# Patient Record
Sex: Male | Born: 1945
Health system: Southern US, Community
[De-identification: ages and names within clinical notes are randomized; demographics above are authoritative.]

## PROBLEM LIST (undated history)

## (undated) DIAGNOSIS — M1990 Unspecified osteoarthritis, unspecified site: Secondary | ICD-10-CM

## (undated) DIAGNOSIS — E785 Hyperlipidemia, unspecified: Secondary | ICD-10-CM

## (undated) DIAGNOSIS — N400 Enlarged prostate without lower urinary tract symptoms: Secondary | ICD-10-CM

## (undated) DIAGNOSIS — J3089 Other allergic rhinitis: Secondary | ICD-10-CM

## (undated) DIAGNOSIS — J189 Pneumonia, unspecified organism: Secondary | ICD-10-CM

## (undated) DIAGNOSIS — K5790 Diverticulosis of intestine, part unspecified, without perforation or abscess without bleeding: Secondary | ICD-10-CM

## (undated) HISTORY — PX: BACK SURGERY: SHX140

## (undated) HISTORY — PX: TONSILLECTOMY: SUR1361

## (undated) HISTORY — PX: OTHER SURGICAL HISTORY: SHX169

## (undated) HISTORY — PX: SIGMOIDOSCOPY: SUR1295

## (undated) HISTORY — PX: EYE SURGERY: SHX253

---

## 1973-01-17 HISTORY — PX: HERNIA REPAIR: SHX51

## 1988-01-18 HISTORY — PX: SHOULDER SURGERY: SHX246

## 2003-01-03 HISTORY — PX: COLONOSCOPY W/ POLYPECTOMY: SHX1380

## 2004-07-08 ENCOUNTER — Encounter: Payer: Self-pay | Admitting: Podiatry

## 2004-07-17 ENCOUNTER — Encounter: Payer: Self-pay | Admitting: Podiatry

## 2008-12-04 ENCOUNTER — Ambulatory Visit: Payer: Self-pay | Admitting: Unknown Physician Specialty

## 2009-01-17 HISTORY — PX: LAMINOTOMY / EXCISION DISK POSTERIOR CERVICAL SPINE: SUR749

## 2009-02-26 ENCOUNTER — Ambulatory Visit: Payer: Self-pay | Admitting: Family Medicine

## 2009-03-17 ENCOUNTER — Ambulatory Visit: Payer: Self-pay | Admitting: Unknown Physician Specialty

## 2009-03-19 ENCOUNTER — Ambulatory Visit: Payer: Self-pay | Admitting: Unknown Physician Specialty

## 2009-03-25 ENCOUNTER — Inpatient Hospital Stay: Payer: Self-pay | Admitting: Internal Medicine

## 2013-05-13 DIAGNOSIS — Z125 Encounter for screening for malignant neoplasm of prostate: Secondary | ICD-10-CM | POA: Diagnosis not present

## 2013-10-11 DIAGNOSIS — Z23 Encounter for immunization: Secondary | ICD-10-CM | POA: Diagnosis not present

## 2013-10-31 DIAGNOSIS — R05 Cough: Secondary | ICD-10-CM | POA: Diagnosis not present

## 2013-10-31 DIAGNOSIS — J45998 Other asthma: Secondary | ICD-10-CM | POA: Diagnosis not present

## 2013-10-31 DIAGNOSIS — J019 Acute sinusitis, unspecified: Secondary | ICD-10-CM | POA: Diagnosis not present

## 2013-11-05 DIAGNOSIS — J180 Bronchopneumonia, unspecified organism: Secondary | ICD-10-CM | POA: Diagnosis not present

## 2013-11-05 DIAGNOSIS — J45909 Unspecified asthma, uncomplicated: Secondary | ICD-10-CM | POA: Diagnosis not present

## 2014-01-01 ENCOUNTER — Ambulatory Visit: Payer: Self-pay | Admitting: Unknown Physician Specialty

## 2014-01-01 DIAGNOSIS — K621 Rectal polyp: Secondary | ICD-10-CM | POA: Diagnosis not present

## 2014-01-01 DIAGNOSIS — D120 Benign neoplasm of cecum: Secondary | ICD-10-CM | POA: Diagnosis not present

## 2014-01-01 DIAGNOSIS — K640 First degree hemorrhoids: Secondary | ICD-10-CM | POA: Diagnosis not present

## 2014-01-01 DIAGNOSIS — K5730 Diverticulosis of large intestine without perforation or abscess without bleeding: Secondary | ICD-10-CM | POA: Diagnosis not present

## 2014-01-01 DIAGNOSIS — Z8371 Family history of colonic polyps: Secondary | ICD-10-CM | POA: Diagnosis not present

## 2014-01-01 DIAGNOSIS — D122 Benign neoplasm of ascending colon: Secondary | ICD-10-CM | POA: Diagnosis not present

## 2014-01-01 DIAGNOSIS — Z1211 Encounter for screening for malignant neoplasm of colon: Secondary | ICD-10-CM | POA: Diagnosis not present

## 2014-01-01 DIAGNOSIS — K648 Other hemorrhoids: Secondary | ICD-10-CM | POA: Diagnosis not present

## 2014-01-01 DIAGNOSIS — D128 Benign neoplasm of rectum: Secondary | ICD-10-CM | POA: Diagnosis not present

## 2014-01-23 DIAGNOSIS — N400 Enlarged prostate without lower urinary tract symptoms: Secondary | ICD-10-CM | POA: Diagnosis not present

## 2014-01-23 DIAGNOSIS — Z125 Encounter for screening for malignant neoplasm of prostate: Secondary | ICD-10-CM | POA: Diagnosis not present

## 2014-01-23 DIAGNOSIS — N528 Other male erectile dysfunction: Secondary | ICD-10-CM | POA: Diagnosis not present

## 2014-01-23 DIAGNOSIS — Z0000 Encounter for general adult medical examination without abnormal findings: Secondary | ICD-10-CM | POA: Diagnosis not present

## 2014-01-23 DIAGNOSIS — Z23 Encounter for immunization: Secondary | ICD-10-CM | POA: Diagnosis not present

## 2014-02-04 DIAGNOSIS — N529 Male erectile dysfunction, unspecified: Secondary | ICD-10-CM | POA: Diagnosis not present

## 2014-02-04 DIAGNOSIS — Z8042 Family history of malignant neoplasm of prostate: Secondary | ICD-10-CM | POA: Diagnosis not present

## 2014-02-04 DIAGNOSIS — N401 Enlarged prostate with lower urinary tract symptoms: Secondary | ICD-10-CM | POA: Diagnosis not present

## 2014-02-21 DIAGNOSIS — H2513 Age-related nuclear cataract, bilateral: Secondary | ICD-10-CM | POA: Diagnosis not present

## 2014-03-20 DIAGNOSIS — R21 Rash and other nonspecific skin eruption: Secondary | ICD-10-CM | POA: Diagnosis not present

## 2014-03-20 DIAGNOSIS — L259 Unspecified contact dermatitis, unspecified cause: Secondary | ICD-10-CM | POA: Diagnosis not present

## 2014-04-07 DIAGNOSIS — Z5181 Encounter for therapeutic drug level monitoring: Secondary | ICD-10-CM | POA: Diagnosis not present

## 2014-04-07 DIAGNOSIS — L821 Other seborrheic keratosis: Secondary | ICD-10-CM | POA: Diagnosis not present

## 2014-04-07 DIAGNOSIS — D485 Neoplasm of uncertain behavior of skin: Secondary | ICD-10-CM | POA: Diagnosis not present

## 2014-04-07 DIAGNOSIS — L438 Other lichen planus: Secondary | ICD-10-CM | POA: Diagnosis not present

## 2014-04-11 ENCOUNTER — Ambulatory Visit: Payer: Self-pay | Admitting: Physician Assistant

## 2014-04-11 DIAGNOSIS — J40 Bronchitis, not specified as acute or chronic: Secondary | ICD-10-CM | POA: Diagnosis not present

## 2014-04-11 DIAGNOSIS — R05 Cough: Secondary | ICD-10-CM | POA: Diagnosis not present

## 2014-04-11 DIAGNOSIS — Z87891 Personal history of nicotine dependence: Secondary | ICD-10-CM | POA: Diagnosis not present

## 2014-05-12 DIAGNOSIS — L814 Other melanin hyperpigmentation: Secondary | ICD-10-CM | POA: Diagnosis not present

## 2014-05-12 DIAGNOSIS — L438 Other lichen planus: Secondary | ICD-10-CM | POA: Diagnosis not present

## 2014-05-12 LAB — SURGICAL PATHOLOGY

## 2014-08-12 ENCOUNTER — Ambulatory Visit: Payer: Self-pay | Admitting: Urology

## 2014-08-12 ENCOUNTER — Encounter: Payer: Self-pay | Admitting: Urology

## 2014-10-02 ENCOUNTER — Other Ambulatory Visit: Payer: Self-pay

## 2014-10-02 ENCOUNTER — Emergency Department: Payer: Medicare Other

## 2014-10-02 ENCOUNTER — Encounter: Payer: Self-pay | Admitting: Emergency Medicine

## 2014-10-02 ENCOUNTER — Emergency Department
Admission: EM | Admit: 2014-10-02 | Discharge: 2014-10-02 | Disposition: A | Discharge status: Home / Self Care | Payer: Medicare Other | Attending: Emergency Medicine | Admitting: Emergency Medicine

## 2014-10-02 DIAGNOSIS — R42 Dizziness and giddiness: Secondary | ICD-10-CM | POA: Diagnosis not present

## 2014-10-02 DIAGNOSIS — R531 Weakness: Secondary | ICD-10-CM | POA: Diagnosis not present

## 2014-10-02 DIAGNOSIS — R112 Nausea with vomiting, unspecified: Secondary | ICD-10-CM | POA: Insufficient documentation

## 2014-10-02 DIAGNOSIS — J439 Emphysema, unspecified: Secondary | ICD-10-CM | POA: Diagnosis not present

## 2014-10-02 DIAGNOSIS — R5383 Other fatigue: Secondary | ICD-10-CM | POA: Diagnosis present

## 2014-10-02 DIAGNOSIS — R110 Nausea: Secondary | ICD-10-CM | POA: Diagnosis not present

## 2014-10-02 LAB — COMPREHENSIVE METABOLIC PANEL
ALT: 24 U/L (ref 17–63)
ANION GAP: 5 (ref 5–15)
AST: 35 U/L (ref 15–41)
Albumin: 4.5 g/dL (ref 3.5–5.0)
Alkaline Phosphatase: 86 U/L (ref 38–126)
BUN: 19 mg/dL (ref 6–20)
CHLORIDE: 113 mmol/L — AB (ref 101–111)
CO2: 24 mmol/L (ref 22–32)
Calcium: 9.2 mg/dL (ref 8.9–10.3)
Creatinine, Ser: 1.02 mg/dL (ref 0.61–1.24)
GFR calc non Af Amer: >60 mL/min (ref >60)
Glucose, Bld: 150 mg/dL — ABNORMAL HIGH (ref 65–99)
Potassium: 4.7 mmol/L (ref 3.5–5.1)
SODIUM: 142 mmol/L (ref 135–145)
Total Bilirubin: 0.8 mg/dL (ref 0.3–1.2)
Total Protein: 7.3 g/dL (ref 6.5–8.1)

## 2014-10-02 LAB — URINALYSIS COMPLETE WITH MICROSCOPIC (ARMC ONLY)
BILIRUBIN URINE: NEGATIVE
Glucose, UA: NEGATIVE mg/dL
Hgb urine dipstick: NEGATIVE
KETONES UR: NEGATIVE mg/dL
Leukocytes, UA: NEGATIVE
NITRITE: NEGATIVE
PROTEIN: NEGATIVE mg/dL
Specific Gravity, Urine: 1.019 (ref 1.005–1.030)
Squamous Epithelial / LPF: NONE SEEN
pH: 5 (ref 5.0–8.0)

## 2014-10-02 LAB — CBC
HCT: 44.1 % (ref 40.0–52.0)
Hemoglobin: 14.6 g/dL (ref 13.0–18.0)
MCH: 27.1 pg (ref 26.0–34.0)
MCHC: 33.1 g/dL (ref 32.0–36.0)
MCV: 81.9 fL (ref 80.0–100.0)
PLATELETS: 190 10*3/uL (ref 150–440)
RBC: 5.38 MIL/uL (ref 4.40–5.90)
RDW: 15.8 % — ABNORMAL HIGH (ref 11.5–14.5)
WBC: 9.5 10*3/uL (ref 3.8–10.6)

## 2014-10-02 LAB — TROPONIN I: Troponin I: <0.03 ng/mL (ref <0.031)

## 2014-10-02 LAB — LIPASE, BLOOD
LIPASE: 32 U/L (ref 22–51)
LIPASE: 27 U/L (ref 22–51)

## 2014-10-02 MED ORDER — ONDANSETRON 4 MG PO TBDP
4.0000 mg | ORAL_TABLET | Freq: Once | ORAL | Status: AC
  Administered 2014-10-02: 4 mg via ORAL

## 2014-10-02 MED ORDER — ONDANSETRON 4 MG PO TBDP
ORAL_TABLET | ORAL | Status: AC
  Administered 2014-10-02: 4 mg via ORAL
  Filled 2014-10-02: qty 1

## 2014-10-02 MED ORDER — SODIUM CHLORIDE 0.9 % IV BOLUS (SEPSIS)
1000.0000 mL | Freq: Once | INTRAVENOUS | Status: AC
  Administered 2014-10-02: 1000 mL via INTRAVENOUS

## 2014-10-02 NOTE — ED Provider Notes (Addendum)
Second troponin is negative urinalysis is normal. Further repeat interview with the patient's extremities reveals that he felt dizzy and spinning. After this had been going on for little bit he became very nauseated and vomited repeatedly. He was dizzy to the point he had difficulty walking straight. All the symptoms are gone now. There is no inducible vertigo there is no nystagmus. Patient moves all extremities equally and well and is no weakness or focal sensory deficits. I feel with a normal CAT scan and normal labs normal EKG and the resolution of symptoms and the fact that the symptoms were accompanied by 6 vigorous nausea and vomiting that this was most likely a peripheral vertigo. I discussed with the patient the fact that since everything is gone now like there is no way of evaluating him for any kind of stroke or TIA but again since there was such pronounced nausea and vomiting most likely this was a peripheral problem he is to come back if he gets any worse or if the symptoms return and he is to follow-up with his regular doctor  Nena Polio, MD 10/02/14 1731 Discussed with neurology on-call he also thinks is likely that it was peripheral with all the nausea and vomiting that was going on.  Nena Polio, MD 10/02/14 1750 Dr. Tamala Julian the neurologist calls back he suggests that we make sure the patient taking 81 mg a day of aspirin. Patient says he is doing that already.  Nena Polio, MD 10/02/14 [phone]

## 2014-10-02 NOTE — ED Provider Notes (Signed)
[facility] Emergency Department Provider Note  ____________________________________________  Time seen: Approximately 2:56 PM  I have reviewed the triage vital signs and the nursing notes.   HISTORY  Chief Complaint Fatigue    HPI George Davis is a 69 y.o. male who is healthy, states he woke up and had some vomiting this morning. Felt somewhat lightheaded. Denied any melena or bright red blood per rectum headache chest pain shortness of breath. He denied any diarrhea or abdominal pain. He had no headache or focal numbness or weakness. He had no abdominal pain. The patient states at this time he feels much better after having thrown up. He did have a normal bowel movement this morning. He did have a remote history of a hernia repair but he has no abdominal tenderness or abdominal painhe denies dysuria or urinary frequency.  History reviewed. No pertinent past medical history.  There are no active problems to display for this patient.   Past Surgical History  Procedure Laterality Date  . Joint replacement      shoulder surgery  . Hernia repair      No current outpatient prescriptions on file.  Allergies Review of patient's allergies indicates no known allergies.  History reviewed. No pertinent family history.  Social History Social History  Substance Use Topics  . Smoking status: Never Smoker   . Smokeless tobacco: None  . Alcohol Use: Yes     Comment: occasionally    Review of Systems Constitutional: No fever/chills Eyes: No visual changes. ENT: No sore throat. Cardiovascular: Denies chest pain. Respiratory: Denies shortness of breath. Gastrointestinal: No abdominal pain.  Positive nausea, positive vomiting.  No diarrhea.  No constipation. Genitourinary: Negative for dysuria. Musculoskeletal: Negative for back pain. Skin: Negative for rash. Neurological: Negative for headaches, focal weakness or numbness. 10-point ROS otherwise  negative.  ____________________________________________   PHYSICAL EXAM:  VITAL SIGNS: ED Triage Vitals  Enc Vitals Group     BP 10/02/14 1030 138/84 mmHg     Pulse Rate 10/02/14 1030 71     Resp 10/02/14 1030 16     Temp 10/02/14 1030 97.4 F (36.3 C)     Temp Source 10/02/14 1030 Oral     SpO2 10/02/14 1026 94 %     Weight 10/02/14 1030 185 lb (83.915 kg)     Height 10/02/14 1030 5\' 8"  (1.727 m)     Head Cir --      Peak Flow --      Pain Score 10/02/14 1031 0     Pain Loc --      Pain Edu? --      Excl. in [place]? --     Constitutional: Alert and oriented. Well appearing and in no acute distress. Eyes: Conjunctivae are normal. PERRL. EOMI. Head: Atraumatic. Nose: No congestion/rhinnorhea. Mouth/Throat: Mucous membranes are moist.  Oropharynx non-erythematous. Neck: No stridor.   Cardiovascular: Normal rate, regular rhythm. Grossly normal heart sounds.  Good peripheral circulation. Respiratory: Normal respiratory effort.  No retractions. Lungs CTAB. Gastrointestinal: Soft and nontender. No distention. No abdominal bruits. No CVA tenderness. Musculoskeletal: No lower extremity tenderness nor edema.  No joint effusions. Neurologic:  Normal speech and language. No gross focal neurologic deficits are appreciated. No gait instability. Skin:  Skin is warm, dry and intact. No rash noted. Psychiatric: Mood and affect are normal. Speech and behavior are normal.  ____________________________________________   LABS (all labs ordered are listed, but only abnormal results are displayed)  Labs Reviewed  CBC - Abnormal; Notable for the following:    RDW 15.8 (*)    All other components within normal limits  COMPREHENSIVE METABOLIC PANEL - Abnormal; Notable for the following:    Chloride 113 (*)    Glucose, Bld 150 (*)    All other components within normal limits  TROPONIN I  LIPASE, BLOOD  LIPASE, BLOOD  TROPONIN I   ____________________________________________  EKG  I  have personally reviewed the patient's EKG, normal sinus rhythm rate 65 bpm no acute ischemic changes ____________________________________________  RADIOLOGY I have reviewed x-rays  ____________________________________________   PROCEDURES  Procedure(s) performed: None  Critical Care performed: None  ____________________________________________   INITIAL IMPRESSION / ASSESSMENT AND PLAN / ED COURSE  Pertinent labs & imaging results that were available during my care of the patient were reviewed by me and considered in my medical decision making (see chart for details).  Patient had vomiting this morning and felt generally unwell, he feels much better now. Serial abdominal exams have be treated no evidence of abdominal tenderness or intra-abdominal pathology. Nothing to suggest SBO. Isolated vomiting in a patient this age can suddenly be many different entities and we have therefore instituted a broad workup. There is no evidence that he had acute R: Ischemia there is no evidence of ischemic gut there is no evidence of pain out of portion to exam there is no evidence of SBO on exam there is no evidence of referred cardiac disease or is no evidence of increased intracranial pressure or etc. Patient feels much better without like to try by mouth challenge, will send a second troponin and if that is negative we will discharge him home with close follow-up with his PCP and return precautions. ____________________________________________   FINAL CLINICAL IMPRESSION(S) / ED DIAGNOSES  Final diagnoses:  None     Schuyler Amor, MD 10/02/14 [phone]

## 2014-10-02 NOTE — ED Notes (Signed)
Patient presents to the ED via EMS from home for weakness and dizziness when sitting up.  Patient reports frequent vomiting this morning.  Patient states he started feeling "sick" last night.  Patient appears pale and weak.  Vomiting x 1 during triage.  Patient is alert and oriented x 4.  Denies headache, denies falling.

## 2014-11-14 DIAGNOSIS — Z23 Encounter for immunization: Secondary | ICD-10-CM | POA: Diagnosis not present

## 2014-12-28 ENCOUNTER — Ambulatory Visit
Admission: EM | Admit: 2014-12-28 | Discharge: 2014-12-28 | Disposition: A | Discharge status: Home / Self Care | Payer: Medicare Other | Attending: Family Medicine | Admitting: Family Medicine

## 2014-12-28 ENCOUNTER — Ambulatory Visit: Payer: Medicare Other

## 2014-12-28 ENCOUNTER — Encounter: Payer: Self-pay | Admitting: Gynecology

## 2014-12-28 DIAGNOSIS — H6593 Unspecified nonsuppurative otitis media, bilateral: Secondary | ICD-10-CM

## 2014-12-28 DIAGNOSIS — R918 Other nonspecific abnormal finding of lung field: Secondary | ICD-10-CM | POA: Insufficient documentation

## 2014-12-28 DIAGNOSIS — J439 Emphysema, unspecified: Secondary | ICD-10-CM

## 2014-12-28 DIAGNOSIS — J0111 Acute recurrent frontal sinusitis: Secondary | ICD-10-CM | POA: Diagnosis not present

## 2014-12-28 DIAGNOSIS — J189 Pneumonia, unspecified organism: Secondary | ICD-10-CM

## 2014-12-28 DIAGNOSIS — R05 Cough: Secondary | ICD-10-CM | POA: Insufficient documentation

## 2014-12-28 DIAGNOSIS — R0602 Shortness of breath: Secondary | ICD-10-CM | POA: Diagnosis not present

## 2014-12-28 LAB — RAPID STREP SCREEN (MED CTR MEBANE ONLY): Streptococcus, Group A Screen (Direct): NEGATIVE

## 2014-12-28 LAB — RAPID INFLUENZA A&B ANTIGENS
Influenza A (ARMC): NOT DETECTED
Influenza B (ARMC): NOT DETECTED

## 2014-12-28 MED ORDER — ACETAMINOPHEN 500 MG PO TABS
1000.0000 mg | ORAL_TABLET | Freq: Once | ORAL | Status: AC
  Administered 2014-12-28: 1000 mg via ORAL

## 2014-12-28 MED ORDER — LEVOFLOXACIN 500 MG PO TABS: 500.0000 mg | ORAL_TABLET | Freq: Every day | ORAL | Status: DC

## 2014-12-28 MED ORDER — IPRATROPIUM-ALBUTEROL 0.5-2.5 (3) MG/3ML IN SOLN
3.0000 mL | Freq: Four times a day (QID) | RESPIRATORY_TRACT | Status: DC
  Administered 2014-12-28: 3 mL via RESPIRATORY_TRACT

## 2014-12-28 MED ORDER — FLUTICASONE PROPIONATE 50 MCG/ACT NA SUSP: 1.0000 | Freq: Two times a day (BID) | NASAL | Status: DC

## 2014-12-28 NOTE — Discharge Instructions (Signed)
Acute Bronchitis °Bronchitis is inflammation of the airways that extend from the windpipe into the lungs (bronchi). The inflammation often causes mucus to develop. This leads to a cough, which is the most common symptom of bronchitis.  °In acute bronchitis, the condition usually develops suddenly and goes away over time, usually in a couple weeks. Smoking, allergies, and asthma can make bronchitis worse. Repeated episodes of bronchitis may cause further lung problems.  °CAUSES °Acute bronchitis is most often caused by the same virus that causes a cold. The virus can spread from person to person (contagious) through coughing, sneezing, and touching contaminated objects. °SIGNS AND SYMPTOMS  °· Cough.   °· Fever.   °· Coughing up mucus.   °· Body aches.   °· Chest congestion.   °· Chills.   °· Shortness of breath.   °· Sore throat.   °DIAGNOSIS  °Acute bronchitis is usually diagnosed through a physical exam. Your health care provider will also ask you questions about your medical history. Tests, such as chest X-rays, are sometimes done to rule out other conditions.  °TREATMENT  °Acute bronchitis usually goes away in a couple weeks. Oftentimes, no medical treatment is necessary. Medicines are sometimes given for relief of fever or cough. Antibiotic medicines are usually not needed but may be prescribed in certain situations. In some cases, an inhaler may be recommended to help reduce shortness of breath and control the cough. A cool mist vaporizer may also be used to help thin bronchial secretions and make it easier to clear the chest.  °HOME CARE INSTRUCTIONS °· Get plenty of rest.   °· Drink enough fluids to keep your urine clear or pale yellow (unless you have a medical condition that requires fluid restriction). Increasing fluids may help thin your respiratory secretions (sputum) and reduce chest congestion, and it will prevent dehydration.   °· Take medicines only as directed by your health care provider. °· If  you were prescribed an antibiotic medicine, finish it all even if you start to feel better. °· Avoid smoking and secondhand smoke. Exposure to cigarette smoke or irritating chemicals will make bronchitis worse. If you are a smoker, consider using nicotine gum or skin patches to help control withdrawal symptoms. Quitting smoking will help your lungs heal faster.   °· Reduce the chances of another bout of acute bronchitis by washing your hands frequently, avoiding people with cold symptoms, and trying not to touch your hands to your mouth, nose, or eyes.   °· Keep all follow-up visits as directed by your health care provider.   °SEEK MEDICAL CARE IF: °Your symptoms do not improve after 1 week of treatment.  °SEEK IMMEDIATE MEDICAL CARE IF: °· You develop an increased fever or chills.   °· You have chest pain.   °· You have severe shortness of breath. °· You have bloody sputum.   °· You develop dehydration. °· You faint or repeatedly feel like you are going to pass out. °· You develop repeated vomiting. °· You develop a severe headache. °MAKE SURE YOU:  °· Understand these instructions. °· Will watch your condition. °· Will get help right away if you are not doing well or get worse. °  °This information is not intended to replace advice given to you by your health care provider. Make sure you discuss any questions you have with your health care provider. °  °Document Released: 02/11/2004 Document Revised: 01/24/2014 Document Reviewed: 06/26/2012 °Elsevier Interactive Patient Education ©2016 Elsevier Inc[employer] °Otitis Media With Effusion °Otitis media with effusion is the presence of fluid in the middle ear. This is   a common problem in children, which often follows ear infections. It may be present for weeks or longer after the infection. Unlike an acute ear infection, otitis media with effusion refers only to fluid behind the ear drum and not infection. Children with repeated ear and sinus infections and allergy problems  are the most likely to get otitis media with effusion. CAUSES  The most frequent cause of the fluid buildup is dysfunction of the eustachian tubes. These are the tubes that drain fluid in the ears to the back of the nose (nasopharynx). SYMPTOMS   The main symptom of this condition is hearing loss. As a result, you or your child may:  Listen to the TV at a loud volume.  Not respond to questions.  Ask "what" often when spoken to.  Mistake or confuse one sound or word for another.  There may be a sensation of fullness or pressure but usually not pain. DIAGNOSIS   Your health care provider will diagnose this condition by examining you or your child's ears.  Your health care provider may test the pressure in you or your child's ear with a tympanometer.  A hearing test may be conducted if the problem persists. TREATMENT   Treatment depends on the duration and the effects of the effusion.  Antibiotics, decongestants, nose drops, and cortisone-type drugs (tablets or nasal spray) may not be helpful.  Children with persistent ear effusions may have delayed language or behavioral problems. Children at risk for developmental delays in hearing, learning, and speech may require referral to a specialist earlier than children not at risk.  You or your child's health care provider may suggest a referral to an ear, nose, and throat surgeon for treatment. The following may help restore normal hearing:  Drainage of fluid.  Placement of ear tubes (tympanostomy tubes).  Removal of adenoids (adenoidectomy). HOME CARE INSTRUCTIONS   Avoid secondhand smoke.  Infants who are breastfed are less likely to have this condition.  Avoid feeding infants while they are lying flat.  Avoid known environmental allergens.  Avoid people who are sick. SEEK MEDICAL CARE IF:   Hearing is not better in 3 months.  Hearing is worse.  Ear pain.  Drainage from the ear.  Dizziness. MAKE SURE YOU:    Understand these instructions.  Will watch your condition.  Will get help right away if you are not doing well or get worse.   This information is not intended to replace advice given to you by your health care provider. Make sure you discuss any questions you have with your health care provider.   Document Released: 02/11/2004 Document Revised: 01/24/2014 Document Reviewed: 07/31/2012 Elsevier Interactive Patient Education 2016 Elsevier Inc. Sinusitis, Adult Sinusitis is redness, soreness, and inflammation of the paranasal sinuses. Paranasal sinuses are air pockets within the bones of your face. They are located beneath your eyes, in the middle of your forehead, and above your eyes. In healthy paranasal sinuses, mucus is able to drain out, and air is able to circulate through them by way of your nose. However, when your paranasal sinuses are inflamed, mucus and air can become trapped. This can allow bacteria and other germs to grow and cause infection. Sinusitis can develop quickly and last only a short time (acute) or continue over a long period (chronic). Sinusitis that lasts for more than 12 weeks is considered chronic. CAUSES Causes of sinusitis include:  Allergies.  Structural abnormalities, such as displacement of the cartilage that separates your nostrils (deviated septum),  which can decrease the air flow through your nose and sinuses and affect sinus drainage.  Functional abnormalities, such as when the small hairs (cilia) that line your sinuses and help remove mucus do not work properly or are not present. SIGNS AND SYMPTOMS Symptoms of acute and chronic sinusitis are the same. The primary symptoms are pain and pressure around the affected sinuses. Other symptoms include:  Upper toothache.  Earache.  Headache.  Bad breath.  Decreased sense of smell and taste.  A cough, which worsens when you are lying flat.  Fatigue.  Fever.  Thick drainage from your nose, which  often is green and may contain pus (purulent).  Swelling and warmth over the affected sinuses. DIAGNOSIS Your health care provider will perform a physical exam. During your exam, your health care provider may perform any of the following to help determine if you have acute sinusitis or chronic sinusitis:  Look in your nose for signs of abnormal growths in your nostrils (nasal polyps).  Tap over the affected sinus to check for signs of infection.  View the inside of your sinuses using an imaging device that has a light attached (endoscope). If your health care provider suspects that you have chronic sinusitis, one or more of the following tests may be recommended:  Allergy tests.  Nasal culture. A sample of mucus is taken from your nose, sent to a lab, and screened for bacteria.  Nasal cytology. A sample of mucus is taken from your nose and examined by your health care provider to determine if your sinusitis is related to an allergy. TREATMENT Most cases of acute sinusitis are related to a viral infection and will resolve on their own within 10 days. Sometimes, medicines are prescribed to help relieve symptoms of both acute and chronic sinusitis. These may include pain medicines, decongestants, nasal steroid sprays, or saline sprays. However, for sinusitis related to a bacterial infection, your health care provider will prescribe antibiotic medicines. These are medicines that will help kill the bacteria causing the infection. Rarely, sinusitis is caused by a fungal infection. In these cases, your health care provider will prescribe antifungal medicine. For some cases of chronic sinusitis, surgery is needed. Generally, these are cases in which sinusitis recurs more than 3 times per year, despite other treatments. HOME CARE INSTRUCTIONS  Drink plenty of water. Water helps thin the mucus so your sinuses can drain more easily.  Use a humidifier.  Inhale steam 3-4 times a day (for example, sit  in the bathroom with the shower running).  Apply a warm, moist washcloth to your face 3-4 times a day, or as directed by your health care provider.  Use saline nasal sprays to help moisten and clean your sinuses.  Take medicines only as directed by your health care provider.  If you were prescribed either an antibiotic or antifungal medicine, finish it all even if you start to feel better. SEEK IMMEDIATE MEDICAL CARE IF:  You have increasing pain or severe headaches.  You have nausea, vomiting, or drowsiness.  You have swelling around your face.  You have vision problems.  You have a stiff neck.  You have difficulty breathing.   This information is not intended to replace advice given to you by your health care provider. Make sure you discuss any questions you have with your health care provider.   Document Released: 01/03/2005 Document Revised: 01/24/2014 Document Reviewed: 01/18/2011 Elsevier Interactive Patient Education 2016 [place] Pneumonia, Adult Pneumonia is an infection of  the lungs. There are different types of pneumonia. One type can develop while a person is in a hospital. A different type, called community-acquired pneumonia, develops in people who are not, or have not recently been, in the hospital or other health care facility.  CAUSES Pneumonia may be caused by bacteria, viruses, or funguses. Community-acquired pneumonia is often caused by Streptococcus pneumonia bacteria. These bacteria are often passed from one person to another by breathing in droplets from the cough or sneeze of an infected person. RISK FACTORS The condition is more likely to develop in:  People who havechronic diseases, such as chronic obstructive pulmonary disease (COPD), asthma, congestive heart failure, cystic fibrosis, diabetes, or kidney disease.  People who haveearly-stage or late-stage HIV.  People who havesickle cell disease.  People who havehad their  spleen removed (splenectomy).  People who havepoor Human resources officer.  People who havemedical conditions that increase the risk of breathing in (aspirating) secretions their own mouth and nose.   People who havea weakened immune system (immunocompromised).  People who smoke.  People whotravel to areas where pneumonia-causing germs commonly exist.  People whoare around animal habitats or animals that have pneumonia-causing germs, including birds, bats, rabbits, cats, and farm animals. SYMPTOMS Symptoms of this condition include:  Adry cough.  A wet (productive) cough.  Fever.  Sweating.  Chest pain, especially when breathing deeply or coughing.  Rapid breathing or difficulty breathing.  Shortness of breath.  Shaking chills.  Fatigue.  Muscle aches. DIAGNOSIS Your health care provider will take a medical history and perform a physical exam. You may also have other tests, including:  Imaging studies of your chest, including X-rays.  Tests to check your blood oxygen level and other blood gases.  Other tests on blood, mucus (sputum), fluid around your lungs (pleural fluid), and urine. If your pneumonia is severe, other tests may be done to identify the specific cause of your illness. TREATMENT The type of treatment that you receive depends on many factors, such as the cause of your pneumonia, the medicines you take, and other medical conditions that you have. For most adults, treatment and recovery from pneumonia may occur at home. In some cases, treatment must happen in a hospital. Treatment may include:  Antibiotic medicines, if the pneumonia was caused by bacteria.  Antiviral medicines, if the pneumonia was caused by a virus.  Medicines that are given by mouth or through an IV tube.  Oxygen.  Respiratory therapy. Although rare, treating severe pneumonia may include:  Mechanical ventilation. This is done if you are not breathing well on your own and you  cannot maintain a safe blood oxygen level.  Thoracentesis. This procedureremoves fluid around one lung or both lungs to help you breathe better. HOME CARE INSTRUCTIONS  Take over-the-counter and prescription medicines only as told by your health care provider.  Only takecough medicine if you are losing sleep. Understand that cough medicine can prevent your body's natural ability to remove mucus from your lungs.  If you were prescribed an antibiotic medicine, take it as told by your health care provider. Do not stop taking the antibiotic even if you start to feel better.  Sleep in a semi-upright position at night. Try sleeping in a reclining chair, or place a few pillows under your head.  Do not use tobacco products, including cigarettes, chewing tobacco, and e-cigarettes. If you need help quitting, ask your health care provider.  Drink enough water to keep your urine clear or pale yellow. This will help  to thin out mucus secretions in your lungs. PREVENTION There are ways that you can decrease your risk of developing community-acquired pneumonia. Consider getting a pneumococcal vaccine if:  You are older than 70 years of age.  You are older than 70 years of age and are undergoing cancer treatment, have chronic lung disease, or have other medical conditions that affect your immune system. Ask your health care provider if this applies to you. There are different types and schedules of pneumococcal vaccines. Ask your health care provider which vaccination option is best for you. You may also prevent community-acquired pneumonia if you take these actions:  Get an influenza vaccine every year. Ask your health care provider which type of influenza vaccine is best for you.  Go to the dentist on a regular basis.  Wash your hands often. Use hand sanitizer if soap and water are not available. SEEK MEDICAL CARE IF:  You have a fever.  You are losing sleep because you cannot control your  cough with cough medicine. SEEK IMMEDIATE MEDICAL CARE IF:  You have worsening shortness of breath.  You have increased chest pain.  Your sickness becomes worse, especially if you are an older adult or have a weakened immune system.  You cough up blood.   This information is not intended to replace advice given to you by your health care provider. Make sure you discuss any questions you have with your health care provider.   Document Released: 01/03/2005 Document Revised: 09/24/2014 Document Reviewed: 04/30/2014 Elsevier Interactive Patient Education [employer].

## 2014-12-28 NOTE — ED Provider Notes (Signed)
CSN: [id number]     Arrival date & time 12/28/14  1507 History   First MD Initiated Contact with Patient 12/28/14 1603     Chief Complaint  Patient presents with  . 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    History reviewed. No pertinent past medical history. Past Surgical History  Procedure Laterality Date  . Joint replacement      shoulder surgery  . Hernia repair    . Laminotomy / excision disk posterior cervical spine    . Sigmoidoscopy     History reviewed. No pertinent family history. Social History  Substance Use Topics  . Smoking status: Former Smoker    Types: Cigarettes  . Smokeless tobacco: None  . Alcohol Use: Yes     Comment: occasionally    Review of Systems  Constitutional: Positive for fever, chills, activity change and fatigue. Negative for diaphoresis, appetite change and unexpected weight change.  HENT: Positive for congestion, postnasal drip and sinus pressure. Negative for dental problem, drooling, ear discharge, ear pain, facial swelling, hearing loss, mouth sores, nosebleeds, rhinorrhea, sneezing, sore throat, tinnitus, trouble swallowing and voice change.   Eyes: Negative for photophobia, pain, discharge, redness, itching and visual disturbance.  Respiratory: Positive for cough, chest tightness and shortness of breath. Negative for choking, wheezing and stridor.   Cardiovascular: Negative for chest pain, palpitations and leg swelling.  Gastrointestinal: Negative for nausea, vomiting, abdominal pain, diarrhea, constipation, blood in stool and abdominal distention.  Endocrine: Negative for cold intolerance and heat  intolerance.  Genitourinary: Negative for dysuria.  Musculoskeletal: Negative for myalgias, back pain, joint swelling, arthralgias, gait problem, neck pain and neck stiffness.  Skin: Negative for color change, pallor, rash and wound.  Allergic/Immunologic: Positive for environmental allergies. Negative for food allergies and immunocompromised state.  Neurological: Negative for dizziness, tremors, seizures, syncope, facial asymmetry, speech difficulty, weakness, light-headedness, numbness and headaches.  Hematological: Negative for adenopathy. Does not bruise/bleed easily.  Psychiatric/Behavioral: Negative for behavioral problems, confusion, sleep disturbance and agitation.    Allergies  Review of patient's allergies indicates no known allergies.  Home Medications   Prior to Admission medications   Medication Sig Start Date End Date Taking? Authorizing Provider  fluticasone (FLONASE) 50 MCG/ACT nasal spray Place 1 spray into both nostrils 2 (two) times daily. 12/28/14 01/17/15  Olen Cordial, NP  levofloxacin (LEVAQUIN) 500 MG tablet Take 1 tablet (500 mg total) by mouth daily. 12/28/14 01/07/15  Olen Cordial, NP   Meds Ordered and Administered this Visit   Medications  ipratropium-albuterol (DUONEB) 0.5-2.5 (3) MG/3ML nebulizer solution 3 mL (3 mLs Nebulization Given 12/28/14 1613)  acetaminophen (TYLENOL) tablet 1,000 mg (1,000 mg Oral Given 12/28/14 1554)    BP 124/79 mmHg  Pulse 114  Temp(Src) 102 F (38.9 C) (Oral)  Ht 5\' 8"  (1.727 m)  Wt 185 lb (83.915 kg)  BMI 28.14 kg/m2  SpO2 95% No data found.   Physical Exam  Constitutional: He is oriented to person, place, and time. Vital signs are normal. He appears well-developed and well-nourished. He is active and cooperative.  Non-toxic appearance. He does not have a sickly appearance. He appears ill. No distress.  HENT:  Head: Normocephalic and atraumatic.  Right Ear: Hearing, external  ear and ear canal normal. A  middle ear effusion is present.  Left Ear: Hearing, external ear and ear canal normal. A middle ear effusion is present.  Nose: Mucosal edema and rhinorrhea present. No nose lacerations, sinus tenderness, nasal deformity, septal deviation or nasal septal hematoma. No epistaxis.  No foreign bodies. Right sinus exhibits maxillary sinus tenderness. Right sinus exhibits no frontal sinus tenderness. Left sinus exhibits maxillary sinus tenderness and frontal sinus tenderness.  Mouth/Throat: Uvula is midline and mucous membranes are normal. Mucous membranes are not pale, not dry and not cyanotic. He does not have dentures. No oral lesions. No trismus in the jaw. Normal dentition. No dental abscesses, uvula swelling, lacerations or dental caries. Posterior oropharyngeal edema and posterior oropharyngeal erythema present. No oropharyngeal exudate or tonsillar abscesses.  Bilateral nasal turbinates edema/erythema white discharge and blood vessals excoriated; bilateral TMs with air fluid level clear; cobblestoning posterior pharynx  Eyes: Conjunctivae, EOM and lids are normal. Pupils are equal, round, and reactive to light. Right eye exhibits no chemosis, no discharge, no exudate and no hordeolum. No foreign body present in the right eye. Left eye exhibits no chemosis, no discharge, no exudate and no hordeolum. No foreign body present in the left eye. Right conjunctiva is not injected. Right conjunctiva has no hemorrhage. Left conjunctiva is not injected. Left conjunctiva has no hemorrhage. No scleral icterus. Right eye exhibits normal extraocular motion and no nystagmus. Left eye exhibits normal extraocular motion and no nystagmus. Right pupil is round and reactive. Left pupil is round and reactive. Pupils are equal.  Neck: Trachea normal and normal range of motion. Neck supple. No tracheal tenderness, no spinous process tenderness and no muscular tenderness present. No rigidity. No tracheal deviation, no edema, no  erythema and normal range of motion present. No thyroid mass and no thyromegaly present.  Cardiovascular: Normal rate, regular rhythm, S1 normal, S2 normal, normal heart sounds and intact distal pulses.  PMI is not displaced.  Exam reveals no gallop and no friction rub.   No murmur heard. Pulmonary/Chest: Effort normal. No stridor. No respiratory distress. He has decreased breath sounds in the right middle field, the right lower field, the left middle field and the left lower field. He has no wheezes. He has no rhonchi. He has no rales.  Negative egophany all fields;   Abdominal: Soft. He exhibits no distension.  Musculoskeletal: Normal range of motion. He exhibits no edema or tenderness.  Lymphadenopathy:       Head (right side): No submental, no submandibular, no tonsillar, no preauricular, no posterior auricular and no occipital adenopathy present.       Head (left side): No submental, no submandibular, no tonsillar, no preauricular, no posterior auricular and no occipital adenopathy present.    He has no cervical adenopathy.       Right cervical: No superficial cervical, no deep cervical and no posterior cervical adenopathy present.      Left cervical: No superficial cervical, no deep cervical and no posterior cervical adenopathy present.  Neurological: He is alert and oriented to person, place, and time. He displays no atrophy and no tremor. No cranial nerve deficit or sensory deficit. He exhibits normal muscle tone. He displays no seizure activity. Coordination and gait normal. GCS eye subscore is 4. GCS verbal subscore is 5. GCS motor subscore is 6.  Skin: Skin is warm, dry and intact. No abrasion, no bruising, no burn, no ecchymosis, no laceration, no lesion, no petechiae and no rash noted. He is  not diaphoretic. No cyanosis or erythema. No pallor. Nails show no clubbing.  Psychiatric: He has a normal mood and affect. His speech is normal and behavior is normal. Judgment and thought content  normal. Cognition and memory are normal.  Nursing note and vitals reviewed.   ED Course  Procedures (including critical care time)  Labs Review Labs Reviewed  RAPID STREP SCREEN (NOT AT [facility])  RAPID INFLUENZA A&B ANTIGENS ([facility] ONLY)  CULTURE, GROUP A STREP [facility] ONLY)    Imaging Review Dg Chest 2 View  12/28/2014  CLINICAL DATA:  Shortness of breath, cough for 1 week EXAM: CHEST  2 VIEW COMPARISON:  10/02/2014 FINDINGS: Cardiomediastinal silhouette is stable. There is patchy infiltrate in left upper lobe and lingula peripheral. This is suspicious for pneumonia. No pulmonary edema. Degenerative changes thoracic spine. Metallic fixation plate noted cervical spine. There is mild elevation of the right hemidiaphragm. IMPRESSION: Patchy infiltrate in left upper lobe and lingula peripheral suspicious for pneumonia. No pulmonary edema. Degenerative changes thoracic spine. Electronically Signed   By: Lahoma Crocker M.D.   On: 12/28/2014 17:36   1613 duoneb 9m cmacamille parker administered  1645 repeat spo2 room air 97% hr 82  Left chest pain still with deep breaths improved airflow after duoneb +rhonchi middle and lower lobes cxr still pending  1800 Discussed chest xray results with patient and given copy of radiology report.  Discussed treatment options agreed with levofloxacin 500mg  po daily x 10 days.  Follow up with PCM in 4 weeks for repeat chest sooner if worsening dyspnea, symptoms not improving after 48-72 hours on antibiotic/albuterol inhaler, hemoptysis  Patient verbalized understanding of information/instructions, agreed with plan of care and had no further questions at this time.   MDM   1. Acute recurrent frontal sinusitis   2. Otitis media with effusion, bilateral   3. Acute exacerbation of emphysema ([place])   4. Community acquired pneumonia    Supportive treatment.   No evidence of invasive bacterial infection, non toxic and well hydrated.  This is most likely self limiting viral  infection.  I do not see where any further testing or imaging is necessary at this time.   I will suggest supportive care, rest, good hygiene and encourage the patient to take adequate fluids.  The patient is to return to clinic or EMERGENCY ROOM if symptoms worsen or change significantly e.g. ear pain, fever, purulent discharge from ears or bleeding.  Exitcare handout on otitis media with effusion given to patient.  Patient verbalized agreement and understanding of treatment plan.    Patient notified rapid strep negative.  Will call with throat culture results typically 48 hours.  Rapid flu negative. Suspect Viral illness: no evidence of invasive bacterial infection, non toxic and well hydrated.  This is most likely self limiting viral infection.  I do not see where any further testing or imaging is necessary at this time.   I will suggest supportive care, rest, good hygiene and encourage the patient to take adequate fluids.  Does not require work excuse.  flonase 1 spray each nostril BID prn, nasal saline 1-2 sprays each nostril prn q2h, tylenol 1000mg  po QID prn.  Discussed honey with lemon and salt water gargles for comfort also.  The patient is to return to clinic or EMERGENCY ROOM if symptoms worsen or change significantly e.g. fever, lethargy, SOB, wheezing.  Exitcare handout on viral illness given to patient.  Patient verbalized agreement and understanding of treatment plan.    Restart flonase  1 spray each nostril BID.  Levofloxacin for pneumonia will also cover for sinusitis.  Discussed black box warning with patient.  No evidence of systemic bacterial infection, non toxic and well hydrated.  I do not see where any further testing or imaging is necessary at this time.   I will suggest supportive care, rest, good hygiene and encourage the patient to take adequate fluids.  The patient is to return to clinic or EMERGENCY ROOM if symptoms worsen or change significantly.  Exitcare handout on sinusitis given  to patient.  Patient verbalized agreement and understanding of treatment plan and had no further questions at this time.   P2:  Hand washing and cover cough  Hx emphysema will start oral prednisone 40mg  po with breakfast if no pneumonia. Albuterol inhaler 1-2 puffs po q4-6h prn cough/wheeze.  Humidifier use.   Bronchitis simple, community acquired, may have started as viral (probably respiratory syncytial, parainfluenza, influenza, or adenovirus), but now evidence of acute purulent bronchitis with resultant bronchial edema and mucus formation.  Viruses are the most common cause of bronchial inflammation in otherwise healthy adults with acute bronchitis.  The appearance of sputum is not predictive of whether a bacterial infection is present.  Purulent sputum is most often caused by viral infections.  There are a small portion of those caused by non-viral agents being Mycoplamsa pneumonia.  Microscopic examination or C&S of sputum in the healthy adult with acute bronchitis is generally not helpful (usually negative or normal respiratory flora) other considerations being cough from upper respiratory tract infections, sinusitis or allergic syndromes (mild asthma or viral pneumonia).  Differential Diagnosis:  reactive airway disease (asthma, allergic aspergillosis (eosinophilia), chronic bronchitis, respiratory infection (Sinusitis, Common cold, pneumonia), congestive heart failure, reflux esophagitis, bronchogenic tumor, aspiration syndromes and/or exposure irritants/tobacco smoke.  In this case, there is no evidence of any invasive bacterial illness.  Most likely viral etiology so will hold on antibiotic treatment.  Advise supportive care with rest, encourage fluids, good hygiene and watch for any worsening symptoms.  If they were to develop:  come back to the office or go to the emergency room if after hours. Without high fever, severe dyspnea, lack of physical findings or other risk factors, I will hold  CBC at  this time.I discussed that approximately 50% of patients with acute bronchitis have a cough that lasts up to three weeks, and 25% for over a month.  Tylenol, one to two tablets every four hours as needed for fever or myalgias.   No aspirin.  Patient instructed to follow up in one week or sooner if symptoms worsen. Patient verbalized agreement and understanding of treatment plan.  P2:  hand washing and cover cough  Pneumonia community acquired simple, community acquired, may have started as viral (probably respiratory syncytial, parainfluenza, influenza, or adenovirus), but now evidence of acute periobronchial pneumonia LUL/lingula on chest xray. Levofloxacin 500mg  po daily x 10 days case discussed with Dr Alveta Heimlich.  Albuterol inhaler 1-2 puffs po q4-6h prn wheezing/cough. Patient to call if no improvement symptoms still having fever or worsening dyspnea after 48 hours of antibiotics.  Given copy of chest xray results, discussed results with patient and he is to follow up with [facility] for re-evaluation in 3 to 4 weeks for repeat chest xray.  exitcare handout on pneumonia given to patient.   Differential Diagnoses:  Reactive Airway Disease (asthma, allergic aspergillosis eosinophilia), chronic bronchitis, respiratory infection (sinusitis, common cold, pneumonia), congestive heart failure, smoke/irritant exposure, reflux esophagitis, bronchogenic tumor, and/or  aspiration syndromes.  I will give   I discussed that approximately 50% of patients with acute bronchitis have a cough that lasts up to three weeks, and 25% for over a month. Tylenol, one to two tablets every four hours as needed for fever or myalgias. Patient instructed to follow up in one week with PCM or sooner if symptoms worsen. Patient verbalized agreement and understanding of treatment plan.  P2:  hand washing and cover cough   Olen Cordial, NP 12/28/14 1810

## 2014-12-28 NOTE — ED Notes (Signed)
Patient c/o chest hurt when coughing and chills x 1 week.

## 2014-12-29 MED ORDER — LEVOFLOXACIN 500 MG PO TABS: 500.0000 mg | ORAL_TABLET | Freq: Every day | ORAL | Status: AC

## 2014-12-29 MED ORDER — FLUTICASONE PROPIONATE 50 MCG/ACT NA SUSP: 1.0000 | Freq: Two times a day (BID) | NASAL | Status: DC

## 2014-12-30 ENCOUNTER — Telehealth: Payer: Self-pay | Admitting: Family Medicine

## 2014-12-30 LAB — CULTURE, GROUP A STREP (THRC)

## 2014-12-30 NOTE — Telephone Encounter (Signed)
Telephone message left for patient throat culture normal/negative.  Wanted to verify he was able to pick up levaquin yesterday from [place] and if symptoms improving also and to please contact clinic and speak with nurse.

## 2015-01-05 NOTE — Telephone Encounter (Signed)
Verified w pharmacy the Rx were picked up for patient use

## 2015-02-20 DIAGNOSIS — Z125 Encounter for screening for malignant neoplasm of prostate: Secondary | ICD-10-CM | POA: Diagnosis not present

## 2015-02-20 DIAGNOSIS — Z0000 Encounter for general adult medical examination without abnormal findings: Secondary | ICD-10-CM | POA: Diagnosis not present

## 2015-04-22 DIAGNOSIS — H2512 Age-related nuclear cataract, left eye: Secondary | ICD-10-CM | POA: Diagnosis not present

## 2015-06-22 DIAGNOSIS — H2512 Age-related nuclear cataract, left eye: Secondary | ICD-10-CM | POA: Diagnosis not present

## 2015-06-23 ENCOUNTER — Encounter: Payer: Self-pay | Admitting: *Deleted

## 2015-07-07 ENCOUNTER — Encounter: Payer: Self-pay | Admitting: *Deleted

## 2015-07-07 ENCOUNTER — Ambulatory Visit
Admission: RE | Admit: 2015-07-07 | Discharge: 2015-07-07 | Disposition: A | Discharge status: Home / Self Care | Payer: Commercial Managed Care - HMO | Source: Ambulatory Visit | Attending: Ophthalmology | Admitting: Ophthalmology

## 2015-07-07 ENCOUNTER — Ambulatory Visit: Payer: Commercial Managed Care - HMO | Admitting: Anesthesiology

## 2015-07-07 ENCOUNTER — Encounter
Admission: RE | Disposition: A | Discharge status: Home / Self Care | Payer: Self-pay | Source: Ambulatory Visit | Attending: Ophthalmology

## 2015-07-07 DIAGNOSIS — K5790 Diverticulosis of intestine, part unspecified, without perforation or abscess without bleeding: Secondary | ICD-10-CM | POA: Insufficient documentation

## 2015-07-07 DIAGNOSIS — H2512 Age-related nuclear cataract, left eye: Secondary | ICD-10-CM | POA: Diagnosis not present

## 2015-07-07 DIAGNOSIS — Z7982 Long term (current) use of aspirin: Secondary | ICD-10-CM | POA: Diagnosis not present

## 2015-07-07 DIAGNOSIS — Z79899 Other long term (current) drug therapy: Secondary | ICD-10-CM | POA: Insufficient documentation

## 2015-07-07 DIAGNOSIS — M1990 Unspecified osteoarthritis, unspecified site: Secondary | ICD-10-CM | POA: Diagnosis not present

## 2015-07-07 DIAGNOSIS — Z87891 Personal history of nicotine dependence: Secondary | ICD-10-CM | POA: Insufficient documentation

## 2015-07-07 HISTORY — DX: Unspecified osteoarthritis, unspecified site: M19.90

## 2015-07-07 HISTORY — DX: Diverticulosis of intestine, part unspecified, without perforation or abscess without bleeding: K57.90

## 2015-07-07 HISTORY — PX: CATARACT EXTRACTION W/PHACO: SHX586

## 2015-07-07 SURGERY — PHACOEMULSIFICATION, CATARACT, WITH IOL INSERTION
Anesthesia: Monitor Anesthesia Care | Site: Eye | Laterality: Left | Wound class: Clean

## 2015-07-07 MED ORDER — ARMC OPHTHALMIC DILATING GEL
1.0000 "application " | OPHTHALMIC | Status: AC | PRN
  Administered 2015-07-07 (×2): 1 via OPHTHALMIC

## 2015-07-07 MED ORDER — MOXIFLOXACIN HCL 0.5 % OP SOLN
OPHTHALMIC | Status: DC | PRN
  Administered 2015-07-07: 1 [drp] via OPHTHALMIC

## 2015-07-07 MED ORDER — NA CHONDROIT SULF-NA HYALURON 40-17 MG/ML IO SOLN
INTRAOCULAR | Status: AC
  Filled 2015-07-07: qty 1

## 2015-07-07 MED ORDER — EPINEPHRINE HCL 1 MG/ML IJ SOLN
INTRAMUSCULAR | Status: AC
  Filled 2015-07-07: qty 1

## 2015-07-07 MED ORDER — POVIDONE-IODINE 5 % OP SOLN
1.0000 "application " | Freq: Once | OPHTHALMIC | Status: AC
  Administered 2015-07-07: 1 via OPHTHALMIC

## 2015-07-07 MED ORDER — FENTANYL CITRATE (PF) 100 MCG/2ML IJ SOLN: 25.0000 ug | INTRAMUSCULAR | Status: DC | PRN

## 2015-07-07 MED ORDER — CARBACHOL 0.01 % IO SOLN
INTRAOCULAR | Status: DC | PRN
  Administered 2015-07-07: 0.5 mL via INTRAOCULAR

## 2015-07-07 MED ORDER — TETRACAINE HCL 0.5 % OP SOLN
1.0000 [drp] | Freq: Once | OPHTHALMIC | Status: AC
  Administered 2015-07-07: 1 [drp] via OPHTHALMIC

## 2015-07-07 MED ORDER — EPINEPHRINE HCL 1 MG/ML IJ SOLN
INTRAOCULAR | Status: DC | PRN
  Administered 2015-07-07: 1 mL via OPHTHALMIC

## 2015-07-07 MED ORDER — CEFUROXIME OPHTHALMIC INJECTION 1 MG/0.1 ML
INJECTION | OPHTHALMIC | Status: DC | PRN
  Administered 2015-07-07: 0.1 mL via INTRACAMERAL

## 2015-07-07 MED ORDER — NA CHONDROIT SULF-NA HYALURON 40-17 MG/ML IO SOLN
INTRAOCULAR | Status: DC | PRN
  Administered 2015-07-07: 1 mL via INTRAOCULAR

## 2015-07-07 MED ORDER — CEFUROXIME OPHTHALMIC INJECTION 1 MG/0.1 ML
INJECTION | OPHTHALMIC | Status: AC
  Filled 2015-07-07: qty 0.1

## 2015-07-07 MED ORDER — SODIUM CHLORIDE 0.9 % IV SOLN
INTRAVENOUS | Status: DC
  Administered 2015-07-07: 50 mL/h via INTRAVENOUS

## 2015-07-07 MED ORDER — MIDAZOLAM HCL 2 MG/2ML IJ SOLN
INTRAMUSCULAR | Status: DC | PRN
  Administered 2015-07-07 (×2): 1 mg via INTRAVENOUS

## 2015-07-07 MED ORDER — ONDANSETRON HCL 4 MG/2ML IJ SOLN: 4.0000 mg | Freq: Once | INTRAMUSCULAR | Status: DC | PRN

## 2015-07-07 MED ORDER — MOXIFLOXACIN HCL 0.5 % OP SOLN: 1.0000 [drp] | OPHTHALMIC | Status: DC | PRN

## 2015-07-07 SURGICAL SUPPLY — 21 items
CANNULA ANT/CHMB 27GA (MISCELLANEOUS) ×3 IMPLANT
CUP MEDICINE 2OZ PLAST GRAD ST (MISCELLANEOUS) ×3 IMPLANT
GLOVE BIO SURGEON STRL SZ8 (GLOVE) ×3 IMPLANT
GLOVE BIOGEL M 6.5 STRL (GLOVE) ×3 IMPLANT
GLOVE SURG LX 8.0 MICRO (GLOVE) ×2
GLOVE SURG LX STRL 8.0 MICRO (GLOVE) ×1 IMPLANT
GOWN STRL REUS W/ TWL LRG LVL3 (GOWN DISPOSABLE) ×2 IMPLANT
GOWN STRL REUS W/TWL LRG LVL3 (GOWN DISPOSABLE) ×4
LENS IOL TECNIS ITEC 20.5 (Intraocular Lens) ×3 IMPLANT
PACK CATARACT (MISCELLANEOUS) ×3 IMPLANT
PACK CATARACT BRASINGTON LX (MISCELLANEOUS) ×3 IMPLANT
PACK EYE AFTER SURG (MISCELLANEOUS) ×3 IMPLANT
SOL BSS BAG (MISCELLANEOUS) ×3
SOL PREP PVP 2OZ (MISCELLANEOUS) ×3
SOLUTION BSS BAG (MISCELLANEOUS) ×1 IMPLANT
SOLUTION PREP PVP 2OZ (MISCELLANEOUS) ×1 IMPLANT
SYR 3ML LL SCALE MARK (SYRINGE) ×3 IMPLANT
SYR 5ML LL (SYRINGE) ×3 IMPLANT
SYR TB 1ML 27GX1/2 LL (SYRINGE) ×3 IMPLANT
WATER STERILE IRR 1000ML POUR (IV SOLUTION) ×3 IMPLANT
WIPE NON LINTING 3.25X3.25 (MISCELLANEOUS) ×3 IMPLANT

## 2015-07-07 NOTE — Anesthesia Postprocedure Evaluation (Signed)
Anesthesia Post Note  Patient: George Davis  Procedure(s) Performed: Procedure(s) (LRB): CATARACT EXTRACTION PHACO AND INTRAOCULAR LENS PLACEMENT (IOC) (Left)  Patient location during evaluation: PACU Anesthesia Type: MAC Level of consciousness: awake, awake and alert, oriented and patient cooperative Pain management: satisfactory to patient Vital Signs Assessment: post-procedure vital signs reviewed and stable Respiratory status: spontaneous breathing Cardiovascular status: blood pressure returned to baseline and stable Postop Assessment: no headache and no backache Anesthetic complications: no    Last Vitals:  Filed Vitals:   07/07/15 0626  BP: 149/88  Pulse: 69  Temp: 36.7 C  Resp: 16    Last Pain: There were no vitals filed for this visit.               Natalia Wittmeyer C

## 2015-07-07 NOTE — Discharge Instructions (Signed)
°  Eye Surgery Discharge Instructions  Expect mild scratchy sensation or mild soreness. DO NOT RUB YOUR EYE!  The day of surgery:  Minimal physical activity, but bed rest is not required  No reading, computer work, or close hand work  No bending, lifting, or straining.  May watch TV  For 24 hours:  No driving, legal decisions, or alcoholic beverages  Safety precautions  Eat anything you prefer: It is better to start with liquids, then soup then solid foods.  _____ Eye patch should be worn until postoperative exam tomorrow.  ____ Solar shield eyeglasses should be worn for comfort in the sunlight/patch while sleeping  Resume all regular medications including aspirin or Coumadin if these were discontinued prior to surgery. You may shower, bathe, shave, or wash your hair. Tylenol may be taken for mild discomfort.  Call your doctor if you experience significant pain, nausea, or vomiting, fever > 101 or other signs of infection. [phone] or [phone] Specific instructions:  Follow-up Information    Follow up with Tim Lair, MD On 07/08/2015.   Specialty:  Ophthalmology   Why:  9:45   Contact information:   [address] [place] [address] [phone]

## 2015-07-07 NOTE — Op Note (Signed)
PREOPERATIVE DIAGNOSIS:  Nuclear sclerotic cataract of the left eye.   POSTOPERATIVE DIAGNOSIS:  nuclear sclerotic cataract left eye   OPERATIVE PROCEDURE:  Procedure(s): CATARACT EXTRACTION PHACO AND INTRAOCULAR LENS PLACEMENT (IOC)   SURGEON:  Birder Robson, MD.   ANESTHESIA:   Anesthesiologist: Iver Nestle, MD CRNA: Jennette Bill  1.      Managed anesthesia care. 2.      Topical tetracaine drops followed by 2% Xylocaine jelly applied in the preoperative holding area.   COMPLICATIONS:  None.   TECHNIQUE:   Stop and chop   DESCRIPTION OF PROCEDURE:  The patient was examined and consented in the preoperative holding area where the aforementioned topical anesthesia was applied to the left eye and then brought back to the Operating Room where the left eye was prepped and draped in the usual sterile ophthalmic fashion and a lid speculum was placed. A paracentesis was created with the side port blade and the anterior chamber was filled with viscoelastic. A near clear corneal incision was performed with the steel keratome. A continuous curvilinear capsulorrhexis was performed with a cystotome followed by the capsulorrhexis forceps. Hydrodissection and hydrodelineation were carried out with BSS on a blunt cannula. The lens was removed in a stop and chop  technique and the remaining cortical material was removed with the irrigation-aspiration handpiece. The capsular bag was inflated with viscoelastic and the Technis [id number] lens was placed in the capsular bag without complication. The remaining viscoelastic was removed from the eye with the irrigation-aspiration handpiece. The wounds were hydrated. The anterior chamber was flushed with Miostat and the eye was inflated to physiologic pressure. 0.1 mL of cefuroxime concentration 10 mg/mL was placed in the anterior chamber. The wounds were found to be water tight. The eye was dressed with Vigamox. The patient was given protective glasses to  wear throughout the day and a shield with which to sleep tonight. The patient was also given drops with which to begin a drop regimen today and will follow-up with me in one day.  Implant Name Type Inv. Item Serial No. Manufacturer Lot No. LRB No. Used  LENS IOL DIOP 20.5 - [id number] Intraocular Lens LENS IOL DIOP 20.5 [id number]   Left 1   Procedure(s) with comments: CATARACT EXTRACTION PHACO AND INTRAOCULAR LENS PLACEMENT (IOC) (Left) - [place] 00:33.8 AP% 20.7 CDE 7.01 fluid pack lot [id number]  Electronically signed: [place] 07/07/2015 7:46 AM

## 2015-07-07 NOTE — OR Nursing (Signed)
cataract glasses

## 2015-07-07 NOTE — Anesthesia Preprocedure Evaluation (Signed)
Anesthesia Evaluation  Patient identified by MRN, date of birth, ID band Patient awake    Reviewed: Allergy & Precautions, NPO status , Patient's Chart, lab work & pertinent test results  Airway Mallampati: II       Dental  (+) Teeth Intact   Pulmonary neg pulmonary ROS, former smoker,    Pulmonary exam normal        Cardiovascular negative cardio ROS   Rhythm:Regular     Neuro/Psych negative neurological ROS     GI/Hepatic negative GI ROS, Neg liver ROS,   Endo/Other  negative endocrine ROS  Renal/GU negative Renal ROS     Musculoskeletal   Abdominal Normal abdominal exam  (+)   Peds negative pediatric ROS (+)  Hematology negative hematology ROS (+)   Anesthesia Other Findings   Reproductive/Obstetrics                             Anesthesia Physical Anesthesia Plan  ASA: II  Anesthesia Plan: MAC   Post-op Pain Management:    Induction: Intravenous  Airway Management Planned: Natural Airway and Nasal Cannula  Additional Equipment:   Intra-op Plan:   Post-operative Plan:   Informed Consent: I have reviewed the patients History and Physical, chart, labs and discussed the procedure including the risks, benefits and alternatives for the proposed anesthesia with the patient or authorized representative who has indicated his/her understanding and acceptance.     Plan Discussed with: CRNA  Anesthesia Plan Comments:         Anesthesia Quick Evaluation

## 2015-07-07 NOTE — H&P (Signed)
  All labs reviewed. Abnormal studies sent to patients PCP when indicated.  Previous H&P reviewed, patient examined, there are NO CHANGES.  George Enriques LOUIS6/20/20177:14 AM

## 2015-07-07 NOTE — Transfer of Care (Signed)
Immediate Anesthesia Transfer of Care Note  Patient: George Davis  Procedure(s) Performed: Procedure(s) with comments: CATARACT EXTRACTION PHACO AND INTRAOCULAR LENS PLACEMENT (IOC) (Left) - [place] 00:33.8 AP% 20.7 CDE 7.01 fluid pack lot [id number]  Patient Location: PACU  Anesthesia Type:MAC  Level of Consciousness: awake, alert  and oriented  Airway & Oxygen Therapy: Patient Spontanous Breathing  Post-op Assessment: Report given to RN and Post -op Vital signs reviewed and stable  Post vital signs: Reviewed and stable  Last Vitals:  Filed Vitals:   07/07/15 0626  BP: 149/88  Pulse: 69  Temp: 36.7 C  Resp: 16    Last Pain: There were no vitals filed for this visit.    Patients Stated Pain Goal: 0 ([id number])  Complications: No apparent anesthesia complications

## 2015-08-05 DIAGNOSIS — H2511 Age-related nuclear cataract, right eye: Secondary | ICD-10-CM | POA: Diagnosis not present

## 2015-08-06 ENCOUNTER — Encounter: Payer: Self-pay | Admitting: *Deleted

## 2015-08-11 ENCOUNTER — Ambulatory Visit: Payer: Commercial Managed Care - HMO | Admitting: Anesthesiology

## 2015-08-11 ENCOUNTER — Ambulatory Visit
Admission: RE | Admit: 2015-08-11 | Discharge: 2015-08-11 | Disposition: A | Discharge status: Home / Self Care | Payer: Commercial Managed Care - HMO | Source: Ambulatory Visit | Attending: Ophthalmology | Admitting: Ophthalmology

## 2015-08-11 ENCOUNTER — Encounter
Admission: RE | Disposition: A | Discharge status: Home / Self Care | Payer: Self-pay | Source: Ambulatory Visit | Attending: Ophthalmology

## 2015-08-11 DIAGNOSIS — Z7982 Long term (current) use of aspirin: Secondary | ICD-10-CM | POA: Diagnosis not present

## 2015-08-11 DIAGNOSIS — M1990 Unspecified osteoarthritis, unspecified site: Secondary | ICD-10-CM | POA: Insufficient documentation

## 2015-08-11 DIAGNOSIS — Z87891 Personal history of nicotine dependence: Secondary | ICD-10-CM | POA: Diagnosis not present

## 2015-08-11 DIAGNOSIS — J40 Bronchitis, not specified as acute or chronic: Secondary | ICD-10-CM | POA: Insufficient documentation

## 2015-08-11 DIAGNOSIS — K5790 Diverticulosis of intestine, part unspecified, without perforation or abscess without bleeding: Secondary | ICD-10-CM | POA: Diagnosis not present

## 2015-08-11 DIAGNOSIS — H2511 Age-related nuclear cataract, right eye: Secondary | ICD-10-CM | POA: Insufficient documentation

## 2015-08-11 DIAGNOSIS — Z79899 Other long term (current) drug therapy: Secondary | ICD-10-CM | POA: Insufficient documentation

## 2015-08-11 DIAGNOSIS — Z9842 Cataract extraction status, left eye: Secondary | ICD-10-CM | POA: Insufficient documentation

## 2015-08-11 DIAGNOSIS — R001 Bradycardia, unspecified: Secondary | ICD-10-CM | POA: Insufficient documentation

## 2015-08-11 HISTORY — DX: Pneumonia, unspecified organism: J18.9

## 2015-08-11 HISTORY — PX: CATARACT EXTRACTION W/PHACO: SHX586

## 2015-08-11 SURGERY — PHACOEMULSIFICATION, CATARACT, WITH IOL INSERTION
Anesthesia: Monitor Anesthesia Care | Site: Eye | Laterality: Right | Wound class: Clean

## 2015-08-11 MED ORDER — TETRACAINE HCL 0.5 % OP SOLN
OPHTHALMIC | Status: AC
  Administered 2015-08-11: 1 [drp] via OPHTHALMIC
  Filled 2015-08-11: qty 2

## 2015-08-11 MED ORDER — CARBACHOL 0.01 % IO SOLN
INTRAOCULAR | Status: DC | PRN
  Administered 2015-08-11: 0.5 mL via INTRAOCULAR

## 2015-08-11 MED ORDER — POVIDONE-IODINE 5 % OP SOLN
1.0000 "application " | Freq: Once | OPHTHALMIC | Status: AC
  Administered 2015-08-11: 1 via OPHTHALMIC

## 2015-08-11 MED ORDER — NA CHONDROIT SULF-NA HYALURON 40-17 MG/ML IO SOLN
INTRAOCULAR | Status: DC | PRN
  Administered 2015-08-11: 1 mL via INTRAOCULAR

## 2015-08-11 MED ORDER — POVIDONE-IODINE 5 % OP SOLN
OPHTHALMIC | Status: AC
Start: 2015-08-11 — End: 2015-08-11
  Administered 2015-08-11: 1 via OPHTHALMIC
  Filled 2015-08-11: qty 30

## 2015-08-11 MED ORDER — EPINEPHRINE HCL 1 MG/ML IJ SOLN
INTRAMUSCULAR | Status: DC | PRN
  Administered 2015-08-11: 1 mL via OPHTHALMIC

## 2015-08-11 MED ORDER — ARMC OPHTHALMIC DILATING GEL
1.0000 "application " | OPHTHALMIC | Status: AC | PRN
  Administered 2015-08-11 (×2): 1 via OPHTHALMIC

## 2015-08-11 MED ORDER — CEFUROXIME OPHTHALMIC INJECTION 1 MG/0.1 ML
INJECTION | OPHTHALMIC | Status: DC | PRN
  Administered 2015-08-11: 0.1 mL via INTRACAMERAL

## 2015-08-11 MED ORDER — NA CHONDROIT SULF-NA HYALURON 40-17 MG/ML IO SOLN
INTRAOCULAR | Status: AC
  Filled 2015-08-11: qty 1

## 2015-08-11 MED ORDER — MOXIFLOXACIN HCL 0.5 % OP SOLN
OPHTHALMIC | Status: DC | PRN
  Administered 2015-08-11: 1 [drp] via OPHTHALMIC

## 2015-08-11 MED ORDER — LIDOCAINE HCL (PF) 1 % IJ SOLN
INTRAMUSCULAR | Status: AC
  Filled 2015-08-11: qty 2

## 2015-08-11 MED ORDER — MOXIFLOXACIN HCL 0.5 % OP SOLN: 1.0000 [drp] | OPHTHALMIC | Status: DC | PRN

## 2015-08-11 MED ORDER — MIDAZOLAM HCL 2 MG/2ML IJ SOLN
INTRAMUSCULAR | Status: DC | PRN
  Administered 2015-08-11: 1 mg via INTRAVENOUS

## 2015-08-11 MED ORDER — EPINEPHRINE HCL 1 MG/ML IJ SOLN
INTRAMUSCULAR | Status: AC
  Filled 2015-08-11: qty 1

## 2015-08-11 MED ORDER — MOXIFLOXACIN HCL 0.5 % OP SOLN
OPHTHALMIC | Status: AC
  Filled 2015-08-11: qty 3

## 2015-08-11 MED ORDER — CEFUROXIME OPHTHALMIC INJECTION 1 MG/0.1 ML
INJECTION | OPHTHALMIC | Status: AC
  Filled 2015-08-11: qty 0.1

## 2015-08-11 MED ORDER — TETRACAINE HCL 0.5 % OP SOLN
1.0000 [drp] | Freq: Once | OPHTHALMIC | Status: AC
  Administered 2015-08-11: 1 [drp] via OPHTHALMIC

## 2015-08-11 MED ORDER — SODIUM CHLORIDE 0.9 % IV SOLN
INTRAVENOUS | Status: DC
  Administered 2015-08-11: 13:00:00 via INTRAVENOUS

## 2015-08-11 MED ORDER — ARMC OPHTHALMIC DILATING GEL
OPHTHALMIC | Status: AC
  Administered 2015-08-11: 1 via OPHTHALMIC
  Filled 2015-08-11: qty 0.25

## 2015-08-11 MED ORDER — FENTANYL CITRATE (PF) 100 MCG/2ML IJ SOLN
INTRAMUSCULAR | Status: DC | PRN
  Administered 2015-08-11: 50 ug via INTRAVENOUS

## 2015-08-11 SURGICAL SUPPLY — 21 items
CANNULA ANT/CHMB 27GA (MISCELLANEOUS) ×3 IMPLANT
CUP MEDICINE 2OZ PLAST GRAD ST (MISCELLANEOUS) ×3 IMPLANT
GLOVE BIO SURGEON STRL SZ8 (GLOVE) ×3 IMPLANT
GLOVE BIOGEL M 6.5 STRL (GLOVE) ×3 IMPLANT
GLOVE SURG LX 8.0 MICRO (GLOVE) ×2
GLOVE SURG LX STRL 8.0 MICRO (GLOVE) ×1 IMPLANT
GOWN STRL REUS W/ TWL LRG LVL3 (GOWN DISPOSABLE) ×2 IMPLANT
GOWN STRL REUS W/TWL LRG LVL3 (GOWN DISPOSABLE) ×4
LENS IOL TECNIS ITEC 20.0 (Intraocular Lens) ×3 IMPLANT
PACK CATARACT (MISCELLANEOUS) ×3 IMPLANT
PACK CATARACT BRASINGTON LX (MISCELLANEOUS) ×3 IMPLANT
PACK EYE AFTER SURG (MISCELLANEOUS) ×3 IMPLANT
SOL BSS BAG (MISCELLANEOUS) ×3
SOL PREP PVP 2OZ (MISCELLANEOUS) ×3
SOLUTION BSS BAG (MISCELLANEOUS) ×1 IMPLANT
SOLUTION PREP PVP 2OZ (MISCELLANEOUS) ×1 IMPLANT
SYR 3ML LL SCALE MARK (SYRINGE) ×3 IMPLANT
SYR 5ML LL (SYRINGE) ×3 IMPLANT
SYR TB 1ML 27GX1/2 LL (SYRINGE) ×3 IMPLANT
WATER STERILE IRR 1000ML POUR (IV SOLUTION) ×3 IMPLANT
WIPE NON LINTING 3.25X3.25 (MISCELLANEOUS) ×3 IMPLANT

## 2015-08-11 NOTE — Anesthesia Preprocedure Evaluation (Signed)
Anesthesia Evaluation  Patient identified by MRN, date of birth, ID band Patient awake    Reviewed: Allergy & Precautions, NPO status , Patient's Chart, lab work & pertinent test results  Airway Mallampati: III  TM Distance: >3 FB Neck ROM: limited    Dental  (+) Poor Dentition, Chipped   Pulmonary neg pulmonary ROS, pneumonia, resolved, former smoker,    Pulmonary exam normal breath sounds clear to auscultation       Cardiovascular negative cardio ROS   Rhythm:Regular Rate:Bradycardia     Neuro/Psych negative neurological ROS     GI/Hepatic negative GI ROS, Neg liver ROS,   Endo/Other  negative endocrine ROS  Renal/GU negative Renal ROS     Musculoskeletal  (+) Arthritis ,   Abdominal Normal abdominal exam  (+)   Peds negative pediatric ROS (+)  Hematology negative hematology ROS (+)   Anesthesia Other Findings Past Medical History: No date: Arthritis     Comment: fingers No date: Diverticulosis No date: Pneumonia     Comment: HISTORY  Reproductive/Obstetrics                             Anesthesia Physical  Anesthesia Plan  ASA: III  Anesthesia Plan: MAC   Post-op Pain Management:    Induction: Intravenous  Airway Management Planned: Natural Airway and Nasal Cannula  Additional Equipment:   Intra-op Plan:   Post-operative Plan:   Informed Consent: I have reviewed the patients History and Physical, chart, labs and discussed the procedure including the risks, benefits and alternatives for the proposed anesthesia with the patient or authorized representative who has indicated his/her understanding and acceptance.     Plan Discussed with: CRNA  Anesthesia Plan Comments:         Anesthesia Quick Evaluation

## 2015-08-11 NOTE — Transfer of Care (Signed)
Immediate Anesthesia Transfer of Care Note  Patient: George Davis  Procedure(s) Performed: Procedure(s) with comments: CATARACT EXTRACTION PHACO AND INTRAOCULAR LENS PLACEMENT (IOC) (Right) - [place] 49.0 AP% 17.8 CDE 8.73 Fluid Pack lot # [id number]  Patient Location: PACU  Anesthesia Type:MAC  Level of Consciousness: awake, alert , oriented and patient cooperative  Airway & Oxygen Therapy: Patient Spontanous Breathing  Post-op Assessment: Report given to RN and Post -op Vital signs reviewed and stable  Post vital signs: Reviewed and stable  Last Vitals:  Vitals:   08/11/15 1240  BP: (!) 156/90  Pulse: 71  Resp: 16  Temp: 37.1 C    Last Pain:  Vitals:   08/11/15 1240  TempSrc: Tympanic         Complications: No apparent anesthesia complications

## 2015-08-11 NOTE — H&P (Signed)
  All labs reviewed. Abnormal studies sent to patients PCP when indicated.  Previous H&P reviewed, patient examined, there are NO CHANGES.  George Yordy LOUIS7/25/20172:08 PM

## 2015-08-11 NOTE — Anesthesia Postprocedure Evaluation (Signed)
Anesthesia Post Note  Patient: George Davis  Procedure(s) Performed: Procedure(s) (LRB): CATARACT EXTRACTION PHACO AND INTRAOCULAR LENS PLACEMENT (IOC) (Right)  Anesthesia Post Evaluation  Last Vitals:  Vitals:   08/11/15 1240  BP: (!) 156/90  Pulse: 71  Resp: 16  Temp: 37.1 C    Last Pain:  Vitals:   08/11/15 1240  TempSrc: Tympanic                 Bernardo Heater

## 2015-08-11 NOTE — Op Note (Signed)
PREOPERATIVE DIAGNOSIS:  Nuclear sclerotic cataract of the right eye.   POSTOPERATIVE DIAGNOSIS: NUCLEAR SCLERTOIC CATARACT RIGHT EYE   OPERATIVE PROCEDURE:  Procedure(s): CATARACT EXTRACTION PHACO AND INTRAOCULAR LENS PLACEMENT (IOC)   SURGEON:  Birder Robson, MD.   ANESTHESIA:  Anesthesiologist: Andria Frames, MD CRNA: Bernardo Heater, CRNA  1.      Managed anesthesia care. 2.      Topical tetracaine drops followed by 2% Xylocaine jelly applied in the preoperative holding area.   COMPLICATIONS:  None.   TECHNIQUE:   Stop and chop   DESCRIPTION OF PROCEDURE:  The patient was examined and consented in the preoperative holding area where the aforementioned topical anesthesia was applied to the right eye and then brought back to the Operating Room where the right eye was prepped and draped in the usual sterile ophthalmic fashion and a lid speculum was placed. A paracentesis was created with the side port blade and the anterior chamber was filled with viscoelastic. A near clear corneal incision was performed with the steel keratome. A continuous curvilinear capsulorrhexis was performed with a cystotome followed by the capsulorrhexis forceps. Hydrodissection and hydrodelineation were carried out with BSS on a blunt cannula. The lens was removed in a stop and chop  technique and the remaining cortical material was removed with the irrigation-aspiration handpiece. The capsular bag was inflated with viscoelastic and the Technis [id number]  lens was placed in the capsular bag without complication. The remaining viscoelastic was removed from the eye with the irrigation-aspiration handpiece. The wounds were hydrated. The anterior chamber was flushed with Miostat and the eye was inflated to physiologic pressure. 0.1 mL of cefuroxime concentration 10 mg/mL was placed in the anterior chamber. The wounds were found to be water tight. The eye was dressed with Vigamox. The patient was given protective glasses  to wear throughout the day and a shield with which to sleep tonight. The patient was also given drops with which to begin a drop regimen today and will follow-up with me in one day.  Implant Name Type Inv. Item Serial No. Manufacturer Lot No. LRB No. Used  LENS IOL DIOP 20.0 - [id number] Intraocular Lens LENS IOL DIOP 20.0 [id number]   Right 1   Procedure(s) with comments: CATARACT EXTRACTION PHACO AND INTRAOCULAR LENS PLACEMENT (IOC) (Right) - [place] 49.0 AP% 17.8 CDE 8.73 Fluid Pack lot # [id number]  Electronically signed: [place] 08/11/2015 2:35 PM

## 2015-08-11 NOTE — Discharge Instructions (Signed)
Eye Surgery Discharge Instructions  Expect mild scratchy sensation or mild soreness. DO NOT RUB YOUR EYE!  The day of surgery:  Minimal physical activity, but bed rest is not required  No reading, computer work, or close hand work  No bending, lifting, or straining.  May watch TV  For 24 hours:  No driving, legal decisions, or alcoholic beverages  Safety precautions  Eat anything you prefer: It is better to start with liquids, then soup then solid foods.  _____ Eye patch should be worn until postoperative exam tomorrow.  ____ Solar shield eyeglasses should be worn for comfort in the sunlight/patch while sleeping  Resume all regular medications including aspirin or Coumadin if these were discontinued prior to surgery. You may shower, bathe, shave, or wash your hair. Tylenol may be taken for mild discomfort.  Call your doctor if you experience significant pain, nausea, or vomiting, fever > 101 or other signs of infection. [phone] or [phone] Specific instructions:  Follow-up Information    PORFILIO,WILLIAM LOUIS, MD Follow up on 08/12/2015.   Specialty:  Ophthalmology Why:  10:10 am Contact information: [address] [place] [address] [phone]

## 2015-09-17 DIAGNOSIS — Z961 Presence of intraocular lens: Secondary | ICD-10-CM | POA: Diagnosis not present

## 2015-11-05 DIAGNOSIS — Z23 Encounter for immunization: Secondary | ICD-10-CM | POA: Diagnosis not present

## 2016-02-23 DIAGNOSIS — Z23 Encounter for immunization: Secondary | ICD-10-CM | POA: Diagnosis not present

## 2016-02-23 DIAGNOSIS — Z125 Encounter for screening for malignant neoplasm of prostate: Secondary | ICD-10-CM | POA: Diagnosis not present

## 2016-02-23 DIAGNOSIS — Z0000 Encounter for general adult medical examination without abnormal findings: Secondary | ICD-10-CM | POA: Diagnosis not present

## 2016-02-23 DIAGNOSIS — B351 Tinea unguium: Secondary | ICD-10-CM | POA: Diagnosis not present

## 2016-02-23 DIAGNOSIS — N401 Enlarged prostate with lower urinary tract symptoms: Secondary | ICD-10-CM | POA: Insufficient documentation

## 2016-03-21 DIAGNOSIS — Z961 Presence of intraocular lens: Secondary | ICD-10-CM | POA: Diagnosis not present

## 2016-08-26 DIAGNOSIS — J3489 Other specified disorders of nose and nasal sinuses: Secondary | ICD-10-CM | POA: Diagnosis not present

## 2016-08-26 DIAGNOSIS — J302 Other seasonal allergic rhinitis: Secondary | ICD-10-CM | POA: Diagnosis not present

## 2016-10-13 DIAGNOSIS — J0101 Acute recurrent maxillary sinusitis: Secondary | ICD-10-CM | POA: Diagnosis not present

## 2016-10-13 DIAGNOSIS — Z8709 Personal history of other diseases of the respiratory system: Secondary | ICD-10-CM | POA: Diagnosis not present

## 2016-11-03 DIAGNOSIS — Z23 Encounter for immunization: Secondary | ICD-10-CM | POA: Diagnosis not present

## 2016-12-28 DIAGNOSIS — G5602 Carpal tunnel syndrome, left upper limb: Secondary | ICD-10-CM | POA: Diagnosis not present

## 2017-01-02 DIAGNOSIS — G5602 Carpal tunnel syndrome, left upper limb: Secondary | ICD-10-CM | POA: Diagnosis not present

## 2017-01-02 DIAGNOSIS — M1812 Unilateral primary osteoarthritis of first carpometacarpal joint, left hand: Secondary | ICD-10-CM | POA: Diagnosis not present

## 2017-01-30 DIAGNOSIS — G8929 Other chronic pain: Secondary | ICD-10-CM | POA: Insufficient documentation

## 2017-01-30 DIAGNOSIS — M25532 Pain in left wrist: Secondary | ICD-10-CM | POA: Diagnosis not present

## 2017-01-30 DIAGNOSIS — R200 Anesthesia of skin: Secondary | ICD-10-CM | POA: Diagnosis not present

## 2017-02-14 DIAGNOSIS — G5602 Carpal tunnel syndrome, left upper limb: Secondary | ICD-10-CM | POA: Insufficient documentation

## 2017-02-28 DIAGNOSIS — G5602 Carpal tunnel syndrome, left upper limb: Secondary | ICD-10-CM | POA: Diagnosis not present

## 2017-02-28 DIAGNOSIS — M1812 Unilateral primary osteoarthritis of first carpometacarpal joint, left hand: Secondary | ICD-10-CM | POA: Insufficient documentation

## 2017-02-28 DIAGNOSIS — M79642 Pain in left hand: Secondary | ICD-10-CM | POA: Diagnosis not present

## 2017-03-02 ENCOUNTER — Encounter
Admission: RE | Admit: 2017-03-02 | Discharge: 2017-03-02 | Disposition: A | Discharge status: Home / Self Care | Payer: Medicare HMO | Source: Ambulatory Visit | Attending: Unknown Physician Specialty | Admitting: Unknown Physician Specialty

## 2017-03-02 ENCOUNTER — Other Ambulatory Visit: Payer: Self-pay

## 2017-03-02 HISTORY — DX: Benign prostatic hyperplasia without lower urinary tract symptoms: N40.0

## 2017-03-02 HISTORY — DX: Other allergic rhinitis: J30.89

## 2017-03-02 HISTORY — DX: Hyperlipidemia, unspecified: E78.5

## 2017-03-02 NOTE — Patient Instructions (Signed)
Your procedure is scheduled on: 03/08/17 Wed Report to Same Day Surgery 2nd floor medical mall [facility] Entrance-take elevator on left to 2nd floor.  Check in with surgery information desk.) To find out your arrival time please call [phone] between 1PM - 3PM on 03/07/17 Tues  Remember: Instructions that are not followed completely may result in serious medical risk, up to and including death, or upon the discretion of your surgeon and anesthesiologist your surgery may need to be rescheduled.    _x___ 1. Do not eat food after midnight the night before your procedure. You may drink clear liquids up to 2 hours before you are scheduled to arrive at the hospital for your procedure.  Do not drink clear liquids within 2 hours of your scheduled arrival to the hospital.  Clear liquids include  --Water or Apple juice without pulp  --Clear carbohydrate beverage such as ClearFast or Gatorade  --Black Coffee or Clear Tea (No milk, no creamers, do not add anything to                  the coffee or Tea Type 1 and type 2 diabetics should only drink water.  No gum chewing or hard candies.     __x__ 2. No Alcohol for 24 hours before or after surgery.   __x__3. No Smoking or e-cigarettes for 24 prior to surgery.  Do not use any chewable tobacco products for at least 6 hour prior to surgery   ____  4. Bring all medications with you on the day of surgery if instructed.    __x__ 5. Notify your doctor if there is any change in your medical condition     (cold, fever, infections).    x___6. On the morning of surgery brush your teeth with toothpaste and water.  You may rinse your mouth with mouth wash if you wish.  Do not swallow any toothpaste or mouthwash.   Do not wear jewelry, make-up, hairpins, clips or nail polish.  Do not wear lotions, powders, or perfumes. You may wear deodorant.  Do not shave 48 hours prior to surgery. Men may shave face and neck.  Do not bring valuables to the hospital.     [facility] is not responsible for any belongings or valuables.               Contacts, dentures or bridgework may not be worn into surgery.  Leave your suitcase in the car. After surgery it may be brought to your room.  For patients admitted to the hospital, discharge time is determined by your                       treatment team.  _  Patients discharged the day of surgery will not be allowed to drive home.  You will need someone to drive you home and stay with you the night of your procedure.    Please read over the following fact sheets that you were given:   [facility] Preparing for Surgery and or MRSA Information   _x___ Take anti-hypertensive listed below, cardiac, seizure, asthma,     anti-reflux and psychiatric medicines. These include:  1. fluticasone (FLONASE) 50 MCG/ACT nasal spray  2.tamsulosin (FLOMAX) 0.4 MG CAPS capsule  3.  4.  5.  6.  ____Fleets enema or Magnesium Citrate as directed.   _x___ Use CHG Soap or sage wipes as directed on instruction sheet   ____ Use inhalers on the day  of surgery and bring to hospital day of surgery  ____ Stop Metformin and Janumet 2 days prior to surgery.    ____ Take 1/2 of usual insulin dose the night before surgery and none on the morning     surgery.   _x___ Follow recommendations from Cardiologist, Pulmonologist or PCP regarding          stopping Aspirin, Coumadin, Plavix ,Eliquis, Effient, or Pradaxa, and Pletal.  Stop Aspirin today if OK with physician  X____Stop Anti-inflammatories such as Advil, Aleve, Ibuprofen, Motrin, Naproxen, Naprosyn, Goodies powders or aspirin products. OK to take Tylenol and                          Celebrex.   _x___ Stop supplements until after surgery.  But may continue Vitamin D, Vitamin B,       and multivitamin.   ____ Bring C-Pap to the hospital.

## 2017-03-03 ENCOUNTER — Encounter
Admission: RE | Admit: 2017-03-03 | Discharge: 2017-03-03 | Disposition: A | Discharge status: Home / Self Care | Payer: Medicare HMO | Source: Ambulatory Visit | Attending: Unknown Physician Specialty | Admitting: Unknown Physician Specialty

## 2017-03-03 DIAGNOSIS — R9431 Abnormal electrocardiogram [ECG] [EKG]: Secondary | ICD-10-CM | POA: Insufficient documentation

## 2017-03-03 DIAGNOSIS — Z01810 Encounter for preprocedural cardiovascular examination: Secondary | ICD-10-CM | POA: Insufficient documentation

## 2017-03-03 NOTE — Pre-Procedure Instructions (Signed)
Faxed clearance request to [place] at Dr. Scharlene Gloss office and she will handle getting the clearance.

## 2017-03-03 NOTE — Pre-Procedure Instructions (Signed)
Ekg changes noted.  Dr. Ronelle Nigh requested a cardiac clearance.  No cardiologist listed in [place] and PCP is no longer in the area.  Left message for patient to let us know who we should send the request to.  Awaiting return call.  Notified Dr. Scharlene Gloss office of need for clearance.  Will notify them if we don't hear from patient.

## 2017-03-06 DIAGNOSIS — R9431 Abnormal electrocardiogram [ECG] [EKG]: Secondary | ICD-10-CM | POA: Diagnosis not present

## 2017-03-08 ENCOUNTER — Ambulatory Visit
Admission: RE | Admit: 2017-03-08 | Discharge: 2017-03-08 | Disposition: A | Discharge status: Home / Self Care | Payer: Medicare HMO | Source: Ambulatory Visit | Attending: Unknown Physician Specialty | Admitting: Unknown Physician Specialty

## 2017-03-08 ENCOUNTER — Encounter
Admission: RE | Disposition: A | Discharge status: Home / Self Care | Payer: Self-pay | Source: Ambulatory Visit | Attending: Unknown Physician Specialty

## 2017-03-08 ENCOUNTER — Ambulatory Visit: Payer: Medicare HMO | Admitting: Certified Registered Nurse Anesthetist

## 2017-03-08 ENCOUNTER — Other Ambulatory Visit: Payer: Self-pay

## 2017-03-08 DIAGNOSIS — G5602 Carpal tunnel syndrome, left upper limb: Secondary | ICD-10-CM | POA: Diagnosis not present

## 2017-03-08 DIAGNOSIS — Z7982 Long term (current) use of aspirin: Secondary | ICD-10-CM | POA: Diagnosis not present

## 2017-03-08 DIAGNOSIS — R3916 Straining to void: Secondary | ICD-10-CM | POA: Diagnosis not present

## 2017-03-08 DIAGNOSIS — M1812 Unilateral primary osteoarthritis of first carpometacarpal joint, left hand: Secondary | ICD-10-CM | POA: Diagnosis not present

## 2017-03-08 DIAGNOSIS — Z87891 Personal history of nicotine dependence: Secondary | ICD-10-CM | POA: Diagnosis not present

## 2017-03-08 DIAGNOSIS — Z79899 Other long term (current) drug therapy: Secondary | ICD-10-CM | POA: Insufficient documentation

## 2017-03-08 DIAGNOSIS — N401 Enlarged prostate with lower urinary tract symptoms: Secondary | ICD-10-CM | POA: Insufficient documentation

## 2017-03-08 HISTORY — PX: CARPAL TUNNEL RELEASE: SHX101

## 2017-03-08 SURGERY — CARPAL TUNNEL RELEASE
Anesthesia: General | Laterality: Left

## 2017-03-08 MED ORDER — DEXAMETHASONE SODIUM PHOSPHATE 10 MG/ML IJ SOLN
INTRAMUSCULAR | Status: AC
  Filled 2017-03-08: qty 1

## 2017-03-08 MED ORDER — LIDOCAINE HCL (PF) 2 % IJ SOLN
INTRAMUSCULAR | Status: AC
  Filled 2017-03-08: qty 10

## 2017-03-08 MED ORDER — BUPIVACAINE HCL (PF) 0.5 % IJ SOLN
INTRAMUSCULAR | Status: DC | PRN
  Administered 2017-03-08: 5 mL

## 2017-03-08 MED ORDER — ONDANSETRON HCL 4 MG/2ML IJ SOLN
INTRAMUSCULAR | Status: AC
  Filled 2017-03-08: qty 2

## 2017-03-08 MED ORDER — BUPIVACAINE HCL (PF) 0.5 % IJ SOLN
INTRAMUSCULAR | Status: AC
  Filled 2017-03-08: qty 30

## 2017-03-08 MED ORDER — FAMOTIDINE 20 MG PO TABS
ORAL_TABLET | ORAL | Status: AC
  Filled 2017-03-08: qty 1

## 2017-03-08 MED ORDER — FAMOTIDINE 20 MG PO TABS
20.0000 mg | ORAL_TABLET | Freq: Once | ORAL | Status: AC
  Administered 2017-03-08: 20 mg via ORAL

## 2017-03-08 MED ORDER — ONDANSETRON HCL 4 MG/2ML IJ SOLN
INTRAMUSCULAR | Status: DC | PRN
  Administered 2017-03-08: 4 mg via INTRAVENOUS

## 2017-03-08 MED ORDER — ONDANSETRON HCL 4 MG/2ML IJ SOLN: 4.0000 mg | Freq: Once | INTRAMUSCULAR | Status: DC | PRN

## 2017-03-08 MED ORDER — HYDROCODONE-ACETAMINOPHEN 5-325 MG PO TABS
ORAL_TABLET | ORAL | Status: AC
  Filled 2017-03-08: qty 1

## 2017-03-08 MED ORDER — KETOROLAC TROMETHAMINE 30 MG/ML IJ SOLN
INTRAMUSCULAR | Status: DC | PRN
  Administered 2017-03-08: 30 mg via INTRAVENOUS

## 2017-03-08 MED ORDER — PROPOFOL 10 MG/ML IV BOLUS
INTRAVENOUS | Status: DC | PRN
  Administered 2017-03-08: 140 mg via INTRAVENOUS

## 2017-03-08 MED ORDER — HYDROCODONE-ACETAMINOPHEN 5-325 MG PO TABS
1.0000 | ORAL_TABLET | Freq: Once | ORAL | Status: AC
  Administered 2017-03-08: 1 via ORAL

## 2017-03-08 MED ORDER — LACTATED RINGERS IV SOLN
INTRAVENOUS | Status: DC | PRN
  Administered 2017-03-08: 08:00:00 via INTRAVENOUS

## 2017-03-08 MED ORDER — NORCO 5-325 MG PO TABS: 1.0000 | ORAL_TABLET | Freq: Four times a day (QID) | ORAL | 0 refills | Status: DC | PRN

## 2017-03-08 MED ORDER — MIDAZOLAM HCL 2 MG/2ML IJ SOLN
INTRAMUSCULAR | Status: AC
  Filled 2017-03-08: qty 2

## 2017-03-08 MED ORDER — LACTATED RINGERS IV SOLN
INTRAVENOUS | Status: DC
  Administered 2017-03-08: 07:00:00 via INTRAVENOUS

## 2017-03-08 MED ORDER — FENTANYL CITRATE (PF) 100 MCG/2ML IJ SOLN: 25.0000 ug | INTRAMUSCULAR | Status: DC | PRN

## 2017-03-08 MED ORDER — LIDOCAINE HCL (CARDIAC) 20 MG/ML IV SOLN
INTRAVENOUS | Status: DC | PRN
  Administered 2017-03-08: 30 mg via INTRAVENOUS

## 2017-03-08 MED ORDER — KETOROLAC TROMETHAMINE 30 MG/ML IJ SOLN
INTRAMUSCULAR | Status: AC
  Filled 2017-03-08: qty 1

## 2017-03-08 MED ORDER — PROPOFOL 10 MG/ML IV BOLUS
INTRAVENOUS | Status: AC
Start: 2017-03-08 — End: 2017-03-08
  Filled 2017-03-08: qty 20

## 2017-03-08 MED ORDER — MIDAZOLAM HCL 2 MG/2ML IJ SOLN
INTRAMUSCULAR | Status: DC | PRN
  Administered 2017-03-08: 2 mg via INTRAVENOUS

## 2017-03-08 MED ORDER — DEXAMETHASONE SODIUM PHOSPHATE 10 MG/ML IJ SOLN
INTRAMUSCULAR | Status: DC | PRN
  Administered 2017-03-08: 10 mg via INTRAVENOUS

## 2017-03-08 SURGICAL SUPPLY — 25 items
BANDAGE ELASTIC 2 LF NS (GAUZE/BANDAGES/DRESSINGS) ×3 IMPLANT
BNDG ESMARK 4X12 TAN STRL LF (GAUZE/BANDAGES/DRESSINGS) ×3 IMPLANT
CHLORAPREP W/TINT 26ML (MISCELLANEOUS) ×3 IMPLANT
CUFF TOURN 18 STER (MISCELLANEOUS) ×3 IMPLANT
ELECT REM PT RETURN 9FT ADLT (ELECTROSURGICAL) ×3
ELECTRODE REM PT RTRN 9FT ADLT (ELECTROSURGICAL) ×1 IMPLANT
GAUZE SPONGE 4X4 12PLY STRL (GAUZE/BANDAGES/DRESSINGS) ×3 IMPLANT
GLOVE BIO SURGEON STRL SZ8 (GLOVE) ×3 IMPLANT
GLOVE BIOGEL M STRL SZ7.5 (GLOVE) ×3 IMPLANT
GLOVE INDICATOR 8.0 STRL GRN (GLOVE) ×3 IMPLANT
GOWN STRL REUS W/ TWL LRG LVL3 (GOWN DISPOSABLE) ×2 IMPLANT
GOWN STRL REUS W/TWL LRG LVL3 (GOWN DISPOSABLE) ×4
GOWN STRL REUS W/TWL LRG LVL4 (GOWN DISPOSABLE) ×3 IMPLANT
KIT TURNOVER KIT A (KITS) ×3 IMPLANT
NS IRRIG 500ML POUR BTL (IV SOLUTION) ×3 IMPLANT
PACK EXTREMITY ARMC (MISCELLANEOUS) ×3 IMPLANT
PADDING CAST 2X4YD ST (MISCELLANEOUS) ×2
PADDING CAST BLEND 2X4 STRL (MISCELLANEOUS) ×1 IMPLANT
SOL PREP PVP 2OZ (MISCELLANEOUS) ×3
SOLUTION PREP PVP 2OZ (MISCELLANEOUS) ×1 IMPLANT
SPLINT CAST 1 STEP 3X12 (MISCELLANEOUS) ×3 IMPLANT
STOCKINETTE STRL 4IN 9604848 (GAUZE/BANDAGES/DRESSINGS) ×3 IMPLANT
SUT ETHILON 4-0 (SUTURE) ×4
SUT ETHILON 4-0 FS2 18XMFL BLK (SUTURE) ×2
SUTURE ETHLN 4-0 FS2 18XMF BLK (SUTURE) ×2 IMPLANT

## 2017-03-08 NOTE — Op Note (Signed)
DATE OF SURGERY:  03/08/2017  PATIENT NAME:  George Davis   DOB: 1945-10-02  MRN: [id number]  PRE-OPERATIVE DIAGNOSIS: Left carpal tunnel syndrome  POST-OPERATIVE DIAGNOSIS:  Same  PROCEDURE: Left carpal tunnel release  SURGEON: Dr. Leanor Kail, Brooke Bonito. M.D.  ANESTHESIA: General   INDICATIONS FOR SURGERY: George Davis is a 72 y.o. year old male with a long history of numbness and paresthesias in the left hand. Nerve conduction studies demonstrated findings consistent with severe median nerve compression.The patient had not seen any significant improvement despite conservative nonsurgical intervention. After discussion of the risks and benefits of surgical intervention, the patient expressed understanding of the risks benefits and agreed with plans for carpal tunnel release.   PROCEDURE IN DETAIL: The patient was taken the operating room where satisfactory general anesthesia was achieved. A tourniquet was placed on the patient's left upper arm.The left hand and arm were prepped  and draped in the usual sterile fashion. A "time-out" was performed as per usual protocol. The hand and forearm were exsanguinated using an Esmarch and the tourniquet was inflated to 250 mmHg.  An incision was made just ulnar to the thenar palmar crease. Dissection was carried down through the palmar fascia to the transverse carpal ligament. The transverse carpal ligament was sharply incised, taking care to protect the underlying structures within the carpal tunnel. Complete release of the transverse carpal ligament was achieved. There was no evidence of a mass or proliferative synovitis within the carpal tunnel. The median nerve did appear to be slightly flattened. The wound was irrigated with saline. The tourniquet was released at this time. It had been up for about 11 minutes. Bleeding was controlled with digital pressure and coagulation cautery. I did inject the subcutaneous tissue of the wound with about 5 cc of 0.5%  Marcaine without epinephrine. The skin was then re-approximated with interrupted sutures of #4-0 nylon. A sterile dressing was applied followed by application of a volar splint.  The patient was awakened and transferred to a stretcher bed.  The patient tolerated the procedure well and was transported to the PACU in stable condition. Blood loss was negligible.  Dr. Mariel Kansky. M.D.

## 2017-03-08 NOTE — Anesthesia Post-op Follow-up Note (Signed)
Anesthesia QCDR form completed.        

## 2017-03-08 NOTE — H&P (Signed)
  H and P reviewed. No changes. Uploaded at later date. 

## 2017-03-08 NOTE — Anesthesia Procedure Notes (Signed)
Procedure Name: LMA Insertion Date/Time: 03/08/2017 7:36 AM Performed by: Carron Curie, CRNA Pre-anesthesia Checklist: Patient identified Patient Re-evaluated:Patient Re-evaluated prior to induction Oxygen Delivery Method: Circle system utilized Preoxygenation: Pre-oxygenation with 100% oxygen Induction Type: IV induction Ventilation: Mask ventilation without difficulty LMA: LMA inserted LMA Size: 5.0 Number of attempts: 1 Placement Confirmation: positive ETCO2 Tube secured with: Tape Dental Injury: Teeth and Oropharynx as per pre-operative assessment

## 2017-03-08 NOTE — Anesthesia Preprocedure Evaluation (Signed)
Anesthesia Evaluation  Patient identified by MRN, date of birth, ID band Patient awake    Reviewed: Allergy & Precautions, H&P , NPO status , Patient's Chart, lab work & pertinent test results, reviewed documented beta blocker date and time   Airway Mallampati: II  TM Distance: >3 FB Neck ROM: full    Dental  (+) Teeth Intact   Pulmonary neg pulmonary ROS, pneumonia, resolved, former smoker,    Pulmonary exam normal        Cardiovascular Exercise Tolerance: Good negative cardio ROS Normal cardiovascular exam Rate:Normal     Neuro/Psych negative neurological ROS  negative psych ROS   GI/Hepatic negative GI ROS, Neg liver ROS,   Endo/Other  negative endocrine ROS  Renal/GU negative Renal ROS  negative genitourinary   Musculoskeletal   Abdominal   Peds  Hematology negative hematology ROS (+)   Anesthesia Other Findings   Reproductive/Obstetrics negative OB ROS                             Anesthesia Physical Anesthesia Plan  ASA: II  Anesthesia Plan: General LMA   Post-op Pain Management:    Induction:   PONV Risk Score and Plan: 4 or greater and 3  Airway Management Planned:   Additional Equipment:   Intra-op Plan:   Post-operative Plan:   Informed Consent: I have reviewed the patients History and Physical, chart, labs and discussed the procedure including the risks, benefits and alternatives for the proposed anesthesia with the patient or authorized representative who has indicated his/her understanding and acceptance.     Plan Discussed with: CRNA  Anesthesia Plan Comments:         Anesthesia Quick Evaluation

## 2017-03-08 NOTE — Transfer of Care (Signed)
Immediate Anesthesia Transfer of Care Note  Patient: George Davis  Procedure(s) Performed: CARPAL TUNNEL RELEASE (Left )  Patient Location: PACU  Anesthesia Type:General  Level of Consciousness: awake  Airway & Oxygen Therapy: Patient Spontanous Breathing  Post-op Assessment: Report given to RN  Post vital signs: stable  Last Vitals:  Vitals:   03/08/17 0614 03/08/17 0848  BP: (!) 144/92 (P) 124/75  Pulse: 68   Resp: 16 (P) 14  Temp: (!) 36.3 C (!) (P) 36.1 C  SpO2: 98%     Last Pain:  Vitals:   03/08/17 0614  TempSrc: Tympanic         Complications: No apparent anesthesia complications

## 2017-03-08 NOTE — Anesthesia Postprocedure Evaluation (Signed)
Anesthesia Post Note  Patient: George Davis  Procedure(s) Performed: CARPAL TUNNEL RELEASE (Left )  Anesthesia Type: General     Last Vitals:  Vitals:   03/08/17 0614 03/08/17 0848  BP: (!) 144/92 124/75  Pulse: 68 66  Resp: 16 14  Temp: (!) 36.3 C (!) 36.1 C  SpO2: 98% 96%    Last Pain:  Vitals:   03/08/17 0848  TempSrc:   PainSc: 4                  Carron Curie

## 2017-03-08 NOTE — Discharge Instructions (Signed)
Ice pack  RTC in about 2 weeks  Elevation  Keep dressing dry  AMBULATORY SURGERY  DISCHARGE INSTRUCTIONS   1) The drugs that you were given will stay in your system until tomorrow so for the next 24 hours you should not:  A) Drive an automobile B) Make any legal decisions C) Drink any alcoholic beverage   2) You may resume regular meals tomorrow.  Today it is better to start with liquids and gradually work up to solid foods.  You may eat anything you prefer, but it is better to start with liquids, then soup and crackers, and gradually work up to solid foods.   3) Please notify your doctor immediately if you have any unusual bleeding, trouble breathing, redness and pain at the surgery site, drainage, fever, or pain not relieved by medication.    4) Additional Instructions:        Please contact your physician with any problems or Same Day Surgery at [phone], Monday through Friday 6 am to 4 pm, or [place] at [facility] number at [phone].

## 2017-03-13 DIAGNOSIS — R9431 Abnormal electrocardiogram [ECG] [EKG]: Secondary | ICD-10-CM | POA: Insufficient documentation

## 2017-03-15 DIAGNOSIS — Z8371 Family history of colonic polyps: Secondary | ICD-10-CM | POA: Diagnosis not present

## 2017-03-15 DIAGNOSIS — Z86010 Personal history of colonic polyps: Secondary | ICD-10-CM | POA: Diagnosis not present

## 2017-04-03 DIAGNOSIS — R200 Anesthesia of skin: Secondary | ICD-10-CM | POA: Diagnosis not present

## 2017-04-03 DIAGNOSIS — G8929 Other chronic pain: Secondary | ICD-10-CM | POA: Diagnosis not present

## 2017-04-03 DIAGNOSIS — G5602 Carpal tunnel syndrome, left upper limb: Secondary | ICD-10-CM | POA: Diagnosis not present

## 2017-04-03 DIAGNOSIS — M25532 Pain in left wrist: Secondary | ICD-10-CM | POA: Diagnosis not present

## 2017-04-19 DIAGNOSIS — Z13220 Encounter for screening for lipoid disorders: Secondary | ICD-10-CM | POA: Diagnosis not present

## 2017-04-19 DIAGNOSIS — Z8709 Personal history of other diseases of the respiratory system: Secondary | ICD-10-CM | POA: Diagnosis not present

## 2017-04-19 DIAGNOSIS — R3916 Straining to void: Secondary | ICD-10-CM | POA: Diagnosis not present

## 2017-04-19 DIAGNOSIS — N401 Enlarged prostate with lower urinary tract symptoms: Secondary | ICD-10-CM | POA: Diagnosis not present

## 2017-04-19 DIAGNOSIS — Z23 Encounter for immunization: Secondary | ICD-10-CM | POA: Diagnosis not present

## 2017-04-19 DIAGNOSIS — E669 Obesity, unspecified: Secondary | ICD-10-CM | POA: Diagnosis not present

## 2017-04-19 DIAGNOSIS — J309 Allergic rhinitis, unspecified: Secondary | ICD-10-CM | POA: Insufficient documentation

## 2017-04-19 DIAGNOSIS — Z8042 Family history of malignant neoplasm of prostate: Secondary | ICD-10-CM | POA: Diagnosis not present

## 2017-04-19 DIAGNOSIS — Z1159 Encounter for screening for other viral diseases: Secondary | ICD-10-CM | POA: Diagnosis not present

## 2017-04-19 DIAGNOSIS — Z125 Encounter for screening for malignant neoplasm of prostate: Secondary | ICD-10-CM | POA: Diagnosis not present

## 2017-04-19 DIAGNOSIS — Z79899 Other long term (current) drug therapy: Secondary | ICD-10-CM | POA: Diagnosis not present

## 2017-04-19 DIAGNOSIS — Z833 Family history of diabetes mellitus: Secondary | ICD-10-CM | POA: Diagnosis not present

## 2017-04-19 DIAGNOSIS — Z0000 Encounter for general adult medical examination without abnormal findings: Secondary | ICD-10-CM | POA: Diagnosis not present

## 2017-05-24 DIAGNOSIS — Z961 Presence of intraocular lens: Secondary | ICD-10-CM | POA: Diagnosis not present

## 2017-05-25 ENCOUNTER — Ambulatory Visit: Payer: Medicare HMO | Admitting: Urology

## 2017-05-25 ENCOUNTER — Encounter: Payer: Self-pay | Admitting: Urology

## 2017-05-25 VITALS — BP 112/73 | HR 72 | Ht 68.0 in | Wt 195.0 lb

## 2017-05-25 DIAGNOSIS — N138 Other obstructive and reflux uropathy: Secondary | ICD-10-CM

## 2017-05-25 DIAGNOSIS — N401 Enlarged prostate with lower urinary tract symptoms: Secondary | ICD-10-CM | POA: Diagnosis not present

## 2017-05-25 DIAGNOSIS — R972 Elevated prostate specific antigen [PSA]: Secondary | ICD-10-CM | POA: Diagnosis not present

## 2017-05-25 NOTE — Progress Notes (Signed)
05/25/2017 10:39 AM   George Davis 04-Sep-1945 [id number]  Referring provider: [name], MD No address on file  Chief Complaint  Patient presents with  . Elevated PSA    New Patient    HPI: 72 year old male referred for further evaluation of elevated PSA.  Routine annual a bump in his PSA up to 4.45.  He is on a upward trending PSA since 2013.  See PSA trend below.  Father dx with prostate cancer in his 2s which was removed.  He did not die of prostate cancer.    He does take flomax x 2-3 years.  His primary complaint is urinary urgency without incontinence.  Prior to starting flomax, he had difficulty starting with steam, with hesitancy, intermittency, and sensation of incomplete emptying.    He has seen [facility] in the remote past for BPH symptoms with obstruction.    He his relatively healthy, takes 81 mg ASA daily.    PSA trend: 1.78  09/2011 2.52 10/2012 2.1 01/2013 2.61 01/2014 2.67 02/2015 3.4 02/2016 4.45 04/19/2017  PMH: Past Medical History:  Diagnosis Date  . Arthritis    fingers  . BPH (benign prostatic hyperplasia)   . Diverticulosis   . Environmental and seasonal allergies   . Hyperlipidemia   . Pneumonia    HISTORY    Surgical History: Past Surgical History:  Procedure Laterality Date  . BACK SURGERY    . CARPAL TUNNEL RELEASE Left 03/08/2017   Procedure: CARPAL TUNNEL RELEASE;  Surgeon: Leanor Kail, MD;  Location: [facility] ORS;  Service: Orthopedics;  Laterality: Left;  . CATARACT EXTRACTION W/PHACO Left 07/07/2015   Procedure: CATARACT EXTRACTION PHACO AND INTRAOCULAR LENS PLACEMENT (IOC);  Surgeon: Birder Robson, MD;  Location: [facility] ORS;  Service: Ophthalmology;  Laterality: Left;  [place] 00:33.8 AP% 20.7 CDE 7.01 fluid pack lot #[id number]  . CATARACT EXTRACTION W/PHACO Right 08/11/2015   Procedure: CATARACT EXTRACTION PHACO AND INTRAOCULAR LENS PLACEMENT ([place]);  Surgeon: Birder Robson, MD;  Location: [facility] ORS;  Service:  Ophthalmology;  Laterality: Right;  [place] 49.0 AP% 17.8 CDE 8.73 Fluid Pack lot # [id number]  . HERNIA REPAIR Right 1975   inguinal, left in 1980  . LAMINOTOMY / EXCISION DISK POSTERIOR CERVICAL SPINE  2011   metal in neck  . RCR    . SHOULDER SURGERY Right 1990   left done 1985  . SIGMOIDOSCOPY    . TONSILLECTOMY      Home Medications:  Allergies as of 05/25/2017   No Known Allergies     Medication List        Accurate as of 05/25/17 10:39 AM. Always use your most recent med list.          aspirin 81 MG tablet Take 81 mg by mouth daily.   fluticasone 50 MCG/ACT nasal spray Commonly known as:  FLONASE Place 1 spray into both nostrils 2 (two) times daily.   tamsulosin 0.4 MG Caps capsule Commonly known as:  FLOMAX Take 0.4 mg by mouth daily.       Allergies: No Known Allergies  Family History: Family History  Problem Relation Age of Onset  . Stomach cancer Mother   . Prostate cancer Father   . Diabetes Father   . Bladder Cancer Neg Hx   . Kidney cancer Neg Hx     Social History:  reports that he quit smoking about 22 years ago. His smoking use included cigarettes. He has a 28.00 pack-year smoking history. He has never used  smokeless tobacco. He reports that he drinks about 1.2 oz of alcohol per week. His drug history is not on file.  ROS: UROLOGY Frequent Urination?: No Hard to postpone urination?: No Burning/pain with urination?: No Get up at night to urinate?: No Leakage of urine?: No Urine stream starts and stops?: No Trouble starting stream?: No Do you have to strain to urinate?: No Blood in urine?: No Urinary tract infection?: No Sexually transmitted disease?: No Injury to kidneys or bladder?: No Painful intercourse?: No Weak stream?: No Erection problems?: Yes Penile pain?: No  Gastrointestinal Nausea?: No Vomiting?: No Indigestion/heartburn?: No Diarrhea?: No Constipation?: No  Constitutional Fever: No Night sweats?: No Weight loss?:  No Fatigue?: No  Skin Skin rash/lesions?: No Itching?: No  Eyes Blurred vision?: No Double vision?: No  Ears/Nose/Throat Sore throat?: No Sinus problems?: Yes  Hematologic/Lymphatic Swollen glands?: No Easy bruising?: No  Cardiovascular Leg swelling?: No Chest pain?: No  Respiratory Cough?: No Shortness of breath?: No  Endocrine Excessive thirst?: No  Musculoskeletal Back pain?: No Joint pain?: No  Neurological Headaches?: No Dizziness?: No  Psychologic Depression?: No Anxiety?: No  Physical Exam: BP 112/73   Pulse 72   Ht 5\' 8"  (1.727 m)   Wt 195 lb (88.5 kg)   BMI 29.65 kg/m   Constitutional:  Alert and oriented, No acute distress. HEENT: [place] AT, moist mucus membranes.  Trachea midline, no masses. Cardiovascular: No clubbing, cyanosis, or edema. Respiratory: Normal respiratory effort, no increased work of breathing. GI: Abdomen is soft, nontender, nondistended, no abdominal masses GU: No CVA tenderness Rectal: Slightly decreased sphincter tone.  Rubbery 45 cc prostate, nontender with no nodules.   Skin: No rashes, bruises or suspicious lesions. Neurologic: Grossly intact, no focal deficits, moving all 4 extremities. Psychiatric: Normal mood and affect.  Laboratory Data: Lab Results  Component Value Date   WBC 9.5 10/02/2014   HGB 14.6 10/02/2014   HCT 44.1 10/02/2014   MCV 81.9 10/02/2014   PLT 190 10/02/2014    Lab Results  Component Value Date   CREATININE 1.02 10/02/2014   PSA trend as above  Urinalysis UA reviewed today, dip and microscopic is negative.  Pertinent Imaging: N/a  Assessment & Plan:    1. Elevated PSA  We reviewed the implications of an elevated PSA and the uncertainty surrounding it. In general, a man's PSA increases with age and is produced by both normal and cancerous prostate tissue. Differential for elevated PSA is BPH, prostate cancer, infection, recent intercourse/ejaculation, prostate infarction, recent  urethroscopic manipulation (foley placement/cystoscopy) and prostatitis. Management of an elevated PSA can include observation or prostate biopsy and wediscussed this in detail. We discussed that indications for prostate biopsy are defined by age and race specific PSA cutoffs as well as a PSA velocity of 0.75/year.  Patient is overall trend, his PSA has been rising with a more significant rise over the past year of approximately 1 ng/mL.  This is somewhat concerning for prostate cancer.  We will repeat his PSA today and depending on the results, either possibly repeat his PSA again in 3 months to ensure that this is a true upward trend versus proceed with prostate biopsy more immediately.  We discussed prostate biopsy in detail including the procedure itself, the risks of blood in the urine, stool, and ejaculate, serious infection, and discomfort.   We will call him with his repeat PSA results and arrange for the appropriate follow-up, either PSA again in 3 months versus directly to the biopsy. -  PSA  2. BPH with urinary obstruction Significant history of obstructive urinary symptoms improved on Flomax Primary complaint today is urinary frequency Continue Flomax   Return in about 3 months (around 08/25/2017) for PSA (prior to visit).  Hollice Espy, MD  [facility] [address] [place], [place] [address] [phone]

## 2017-05-26 ENCOUNTER — Telehealth: Payer: Self-pay | Admitting: Family Medicine

## 2017-05-26 LAB — PSA: Prostate Specific Ag, Serum: 2.4 ng/mL (ref 0.0–4.0)

## 2017-05-26 NOTE — Telephone Encounter (Signed)
-----   Message from Hollice Espy, MD sent at 05/26/2017 10:50 AM EDT ----- Doristine Devoid news!  When we rechecked your PSA yesterday, it is now back down to 2.4.  This is very reassuring.  I think it be reasonable to recheck your PSA in a year rather than in 3 months as previously discussed.  No need for biopsy at this point in time.  Please reschedule follow-up for in 1 year.  Hollice Espy, MD

## 2017-05-26 NOTE — Telephone Encounter (Signed)
Patient notified and rescheduled appointments

## 2017-06-16 ENCOUNTER — Ambulatory Visit
Admission: RE | Admit: 2017-06-16 | Payer: Medicare HMO | Source: Ambulatory Visit | Admitting: Unknown Physician Specialty

## 2017-08-21 ENCOUNTER — Other Ambulatory Visit: Payer: Medicare HMO

## 2017-08-23 ENCOUNTER — Ambulatory Visit: Payer: Medicare HMO | Admitting: Urology

## 2017-08-26 IMAGING — CT CT HEAD W/O CM
1 series · 16 of 30 positions shown, 20 images · non-contrast
Comparison: None.

CLINICAL DATA: Weakness and dizziness with setting up.

EXAM:
CT HEAD WITHOUT CONTRAST
TECHNIQUE: Contiguous axial images were obtained from the base of the skull
through the vertex without intravenous contrast.

[Series 2: soft tissue · axial · 0.42mm/px · z∈[-89,+46]mm · 16 of 30 slices shown, 20 images]
[im 2/30  brain]
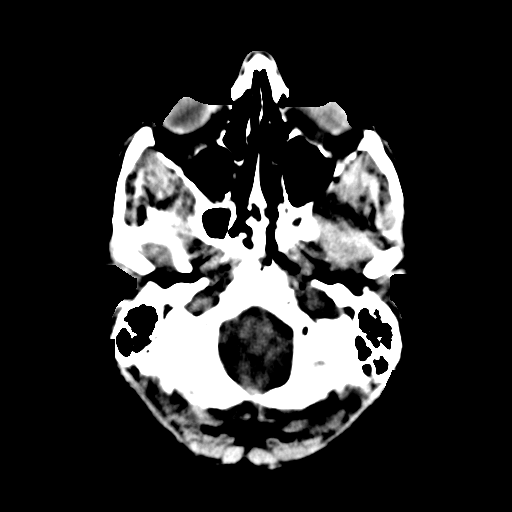
[im 2/30  bone]
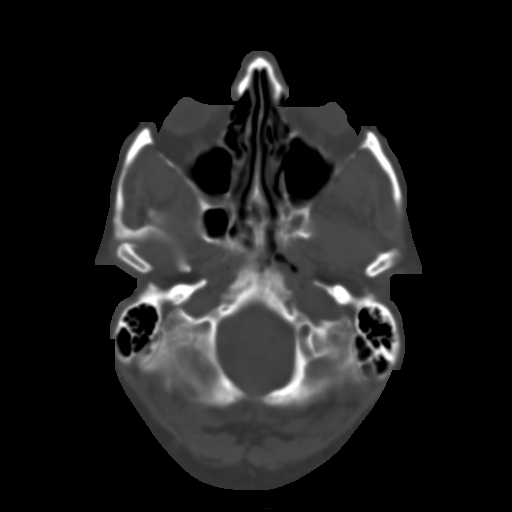
[im 4/30  brain]
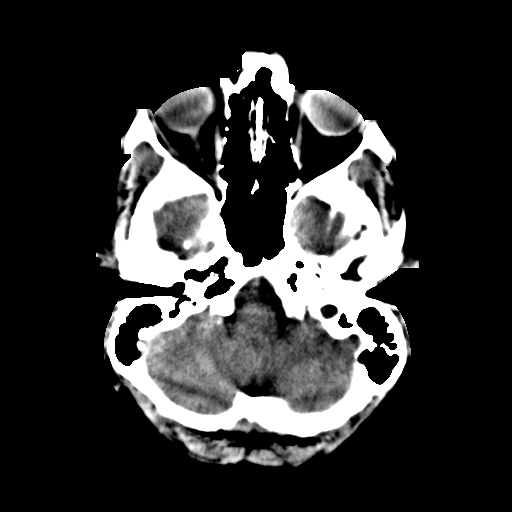
[im 6/30  brain]
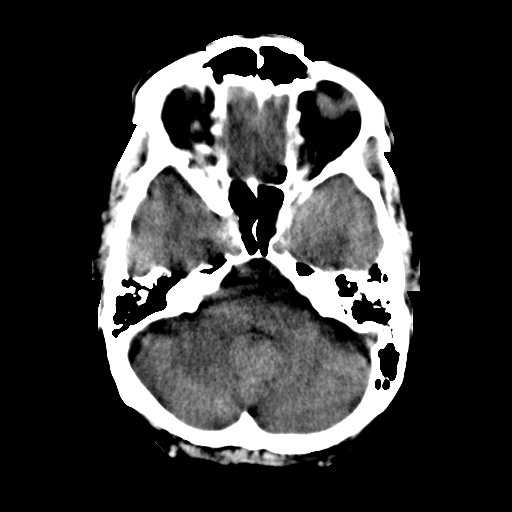
[im 8/30  brain]
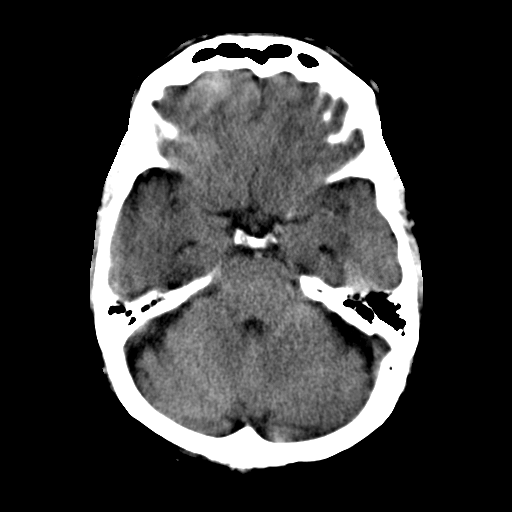
[im 9/30  brain]
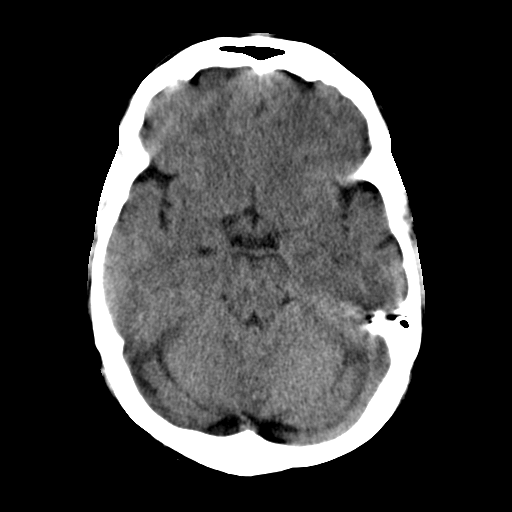
[im 9/30  bone]
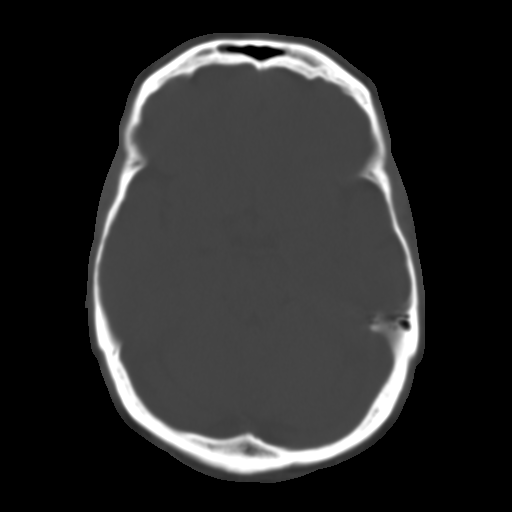
[im 11/30  brain]
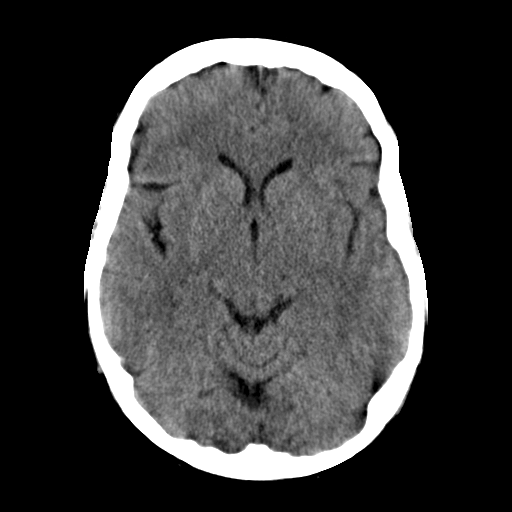
[im 13/30  brain]
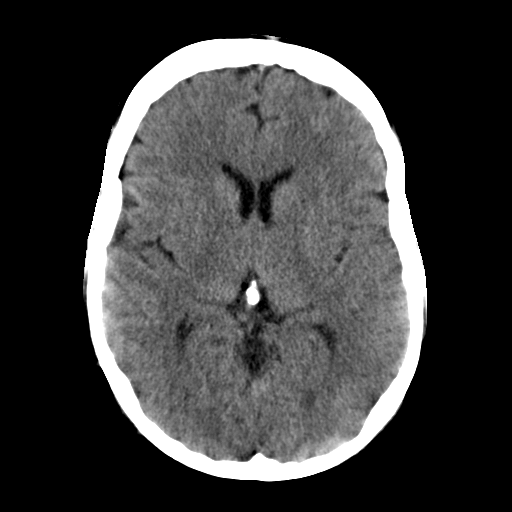
[im 15/30  brain]
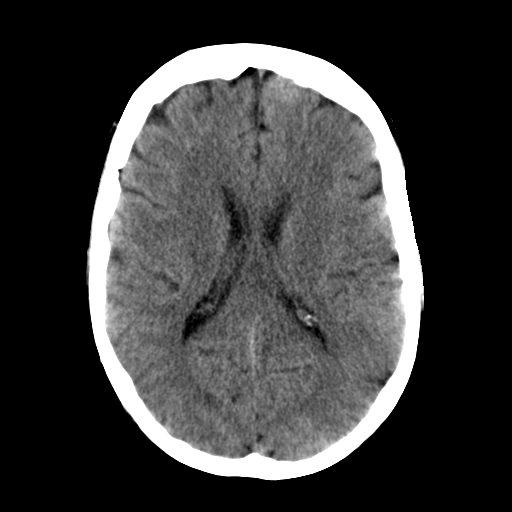
[im 16/30  brain]
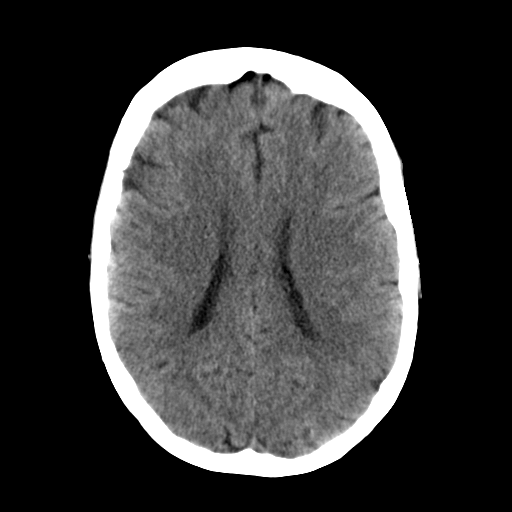
[im 16/30  bone]
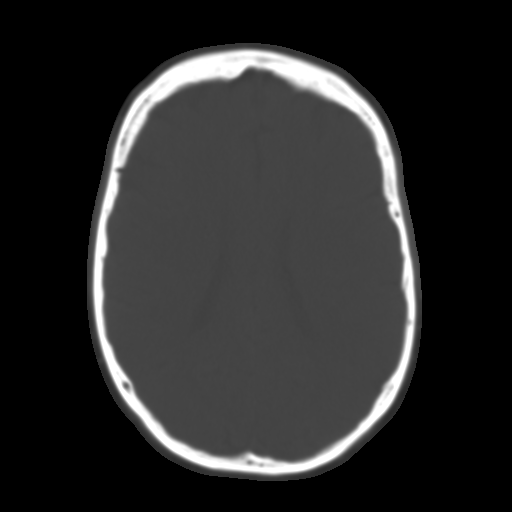
[im 18/30  brain]
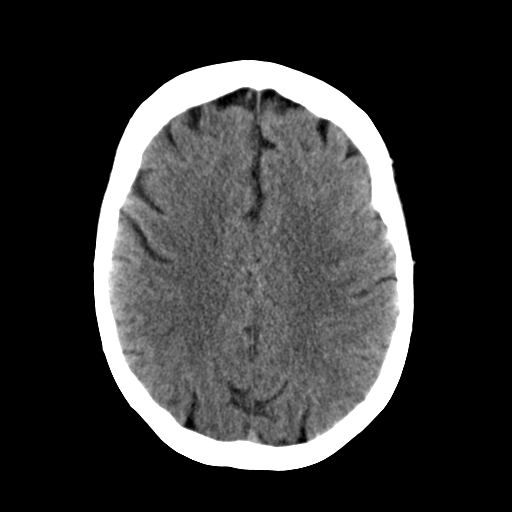
[im 20/30  brain]
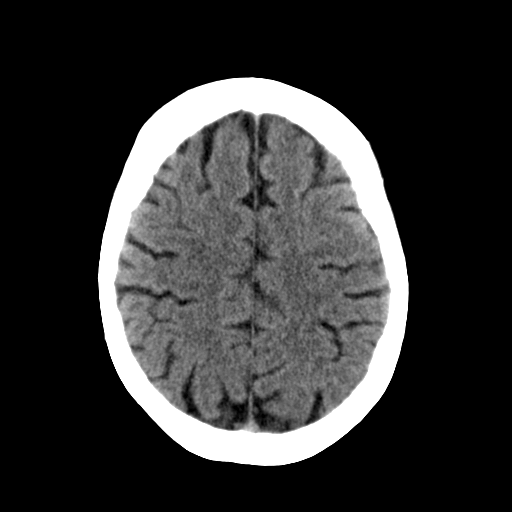
[im 22/30  brain]
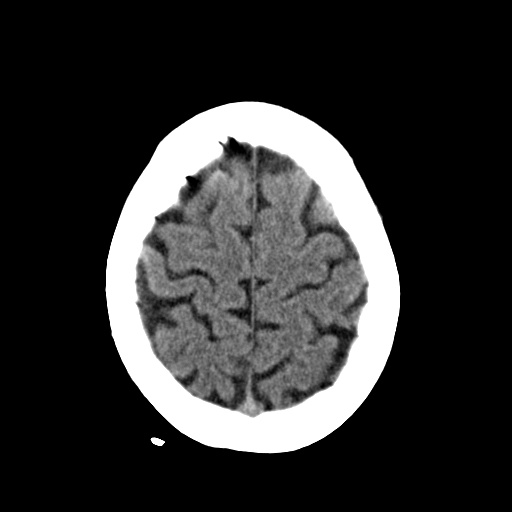
[im 23/30  brain]
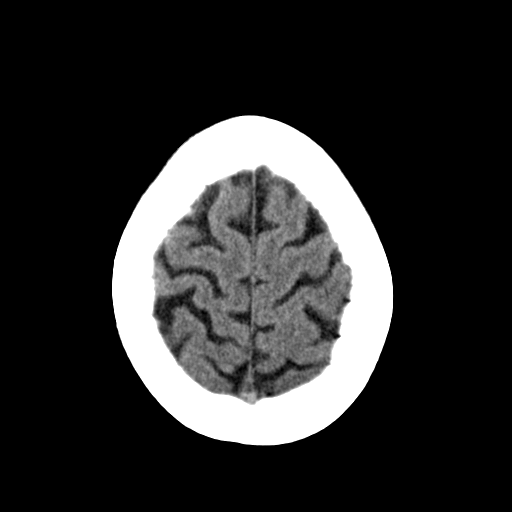
[im 23/30  bone]
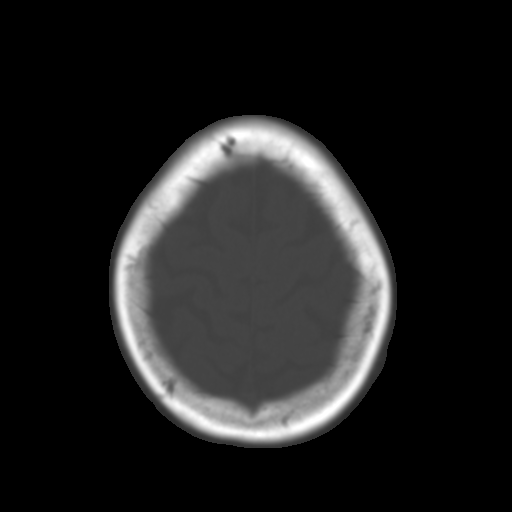
[im 25/30  brain]
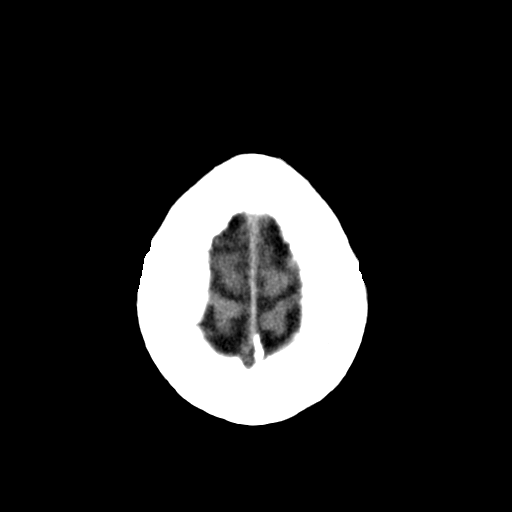
[im 27/30  brain]
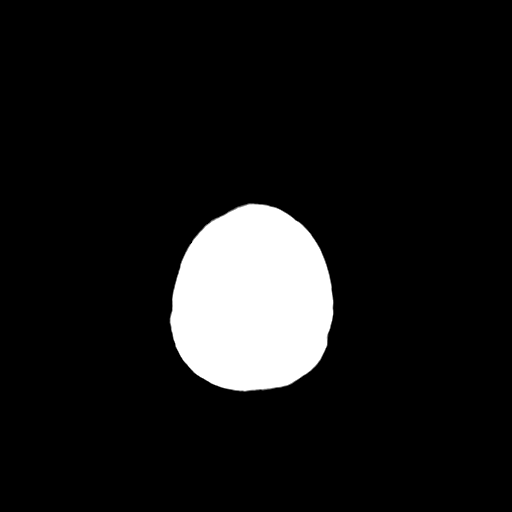
[im 29/30  brain]
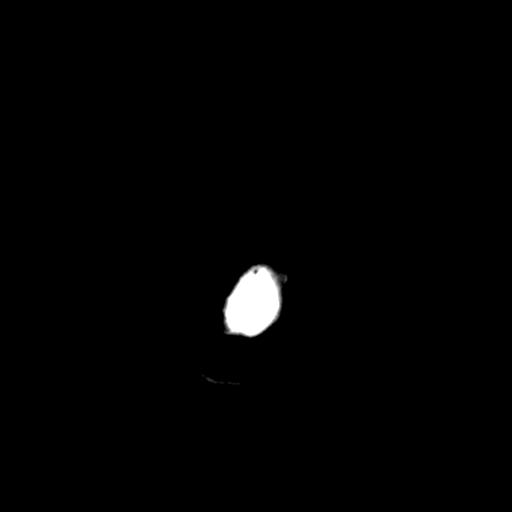

[16 of 30 positions shown; findings below may reference images not displayed]

FINDINGS: Despite efforts by the technologist and patient, motion artifact is
present on today's exam and could not be eliminated. This reduces
exam sensitivity and specificity. The brainstem, cerebellum,
cerebral peduncles, thalamus, basal ganglia, basilar cisterns, and
ventricular system appear within normal limits.

No intracranial hemorrhage, mass lesion, or acute CVA. Visualized
paranasal sinuses appear clear.
IMPRESSION: 1. No significant intracranial abnormality is observed.

## 2017-08-26 IMAGING — CR DG CHEST 2V
2 series · 2 of 2 positions shown · non-contrast
Comparison: 04/11/2014

CLINICAL DATA: Weakness.  Vomiting.  Sweating.

EXAM:
CHEST  2 VIEW

[chest pa]
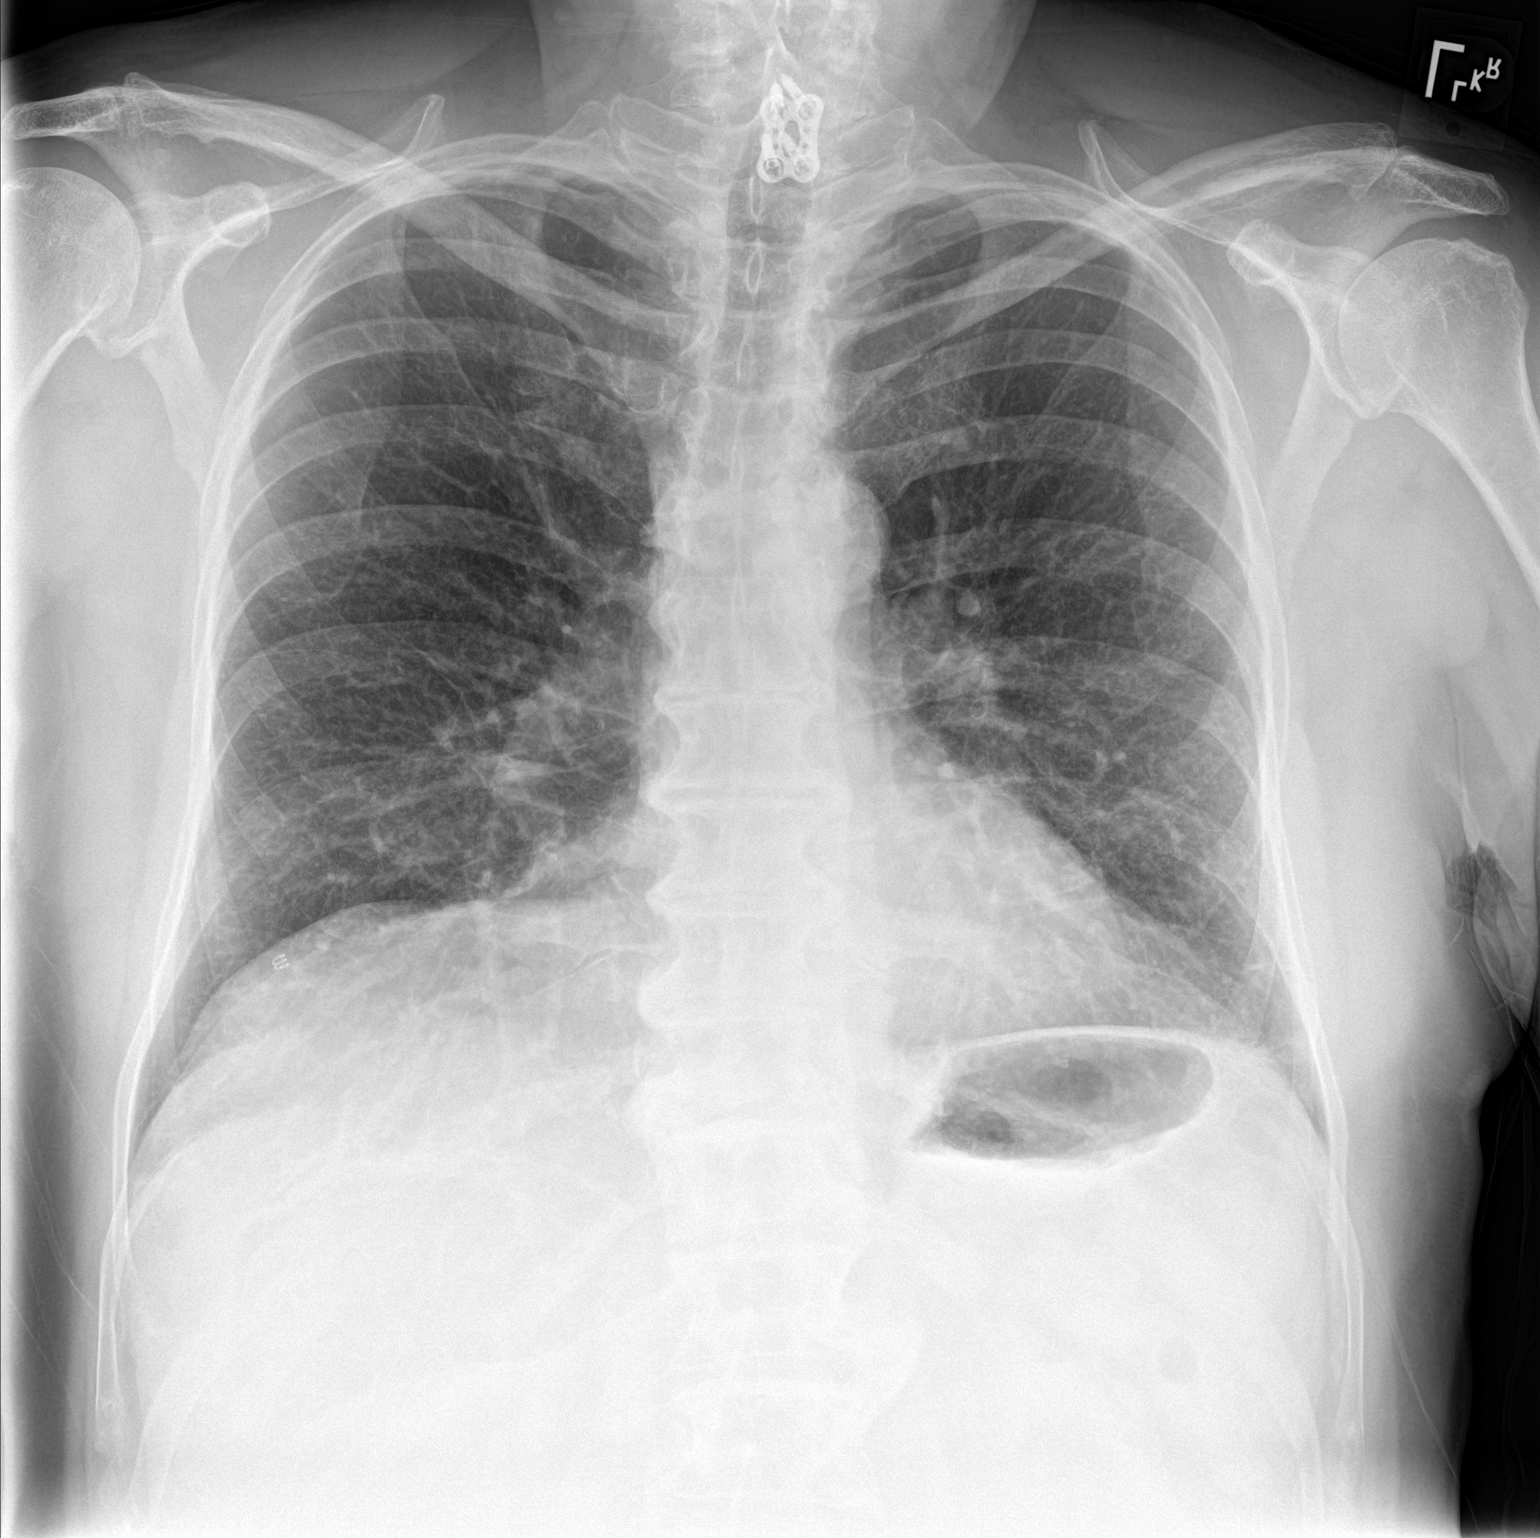

[chest lat]
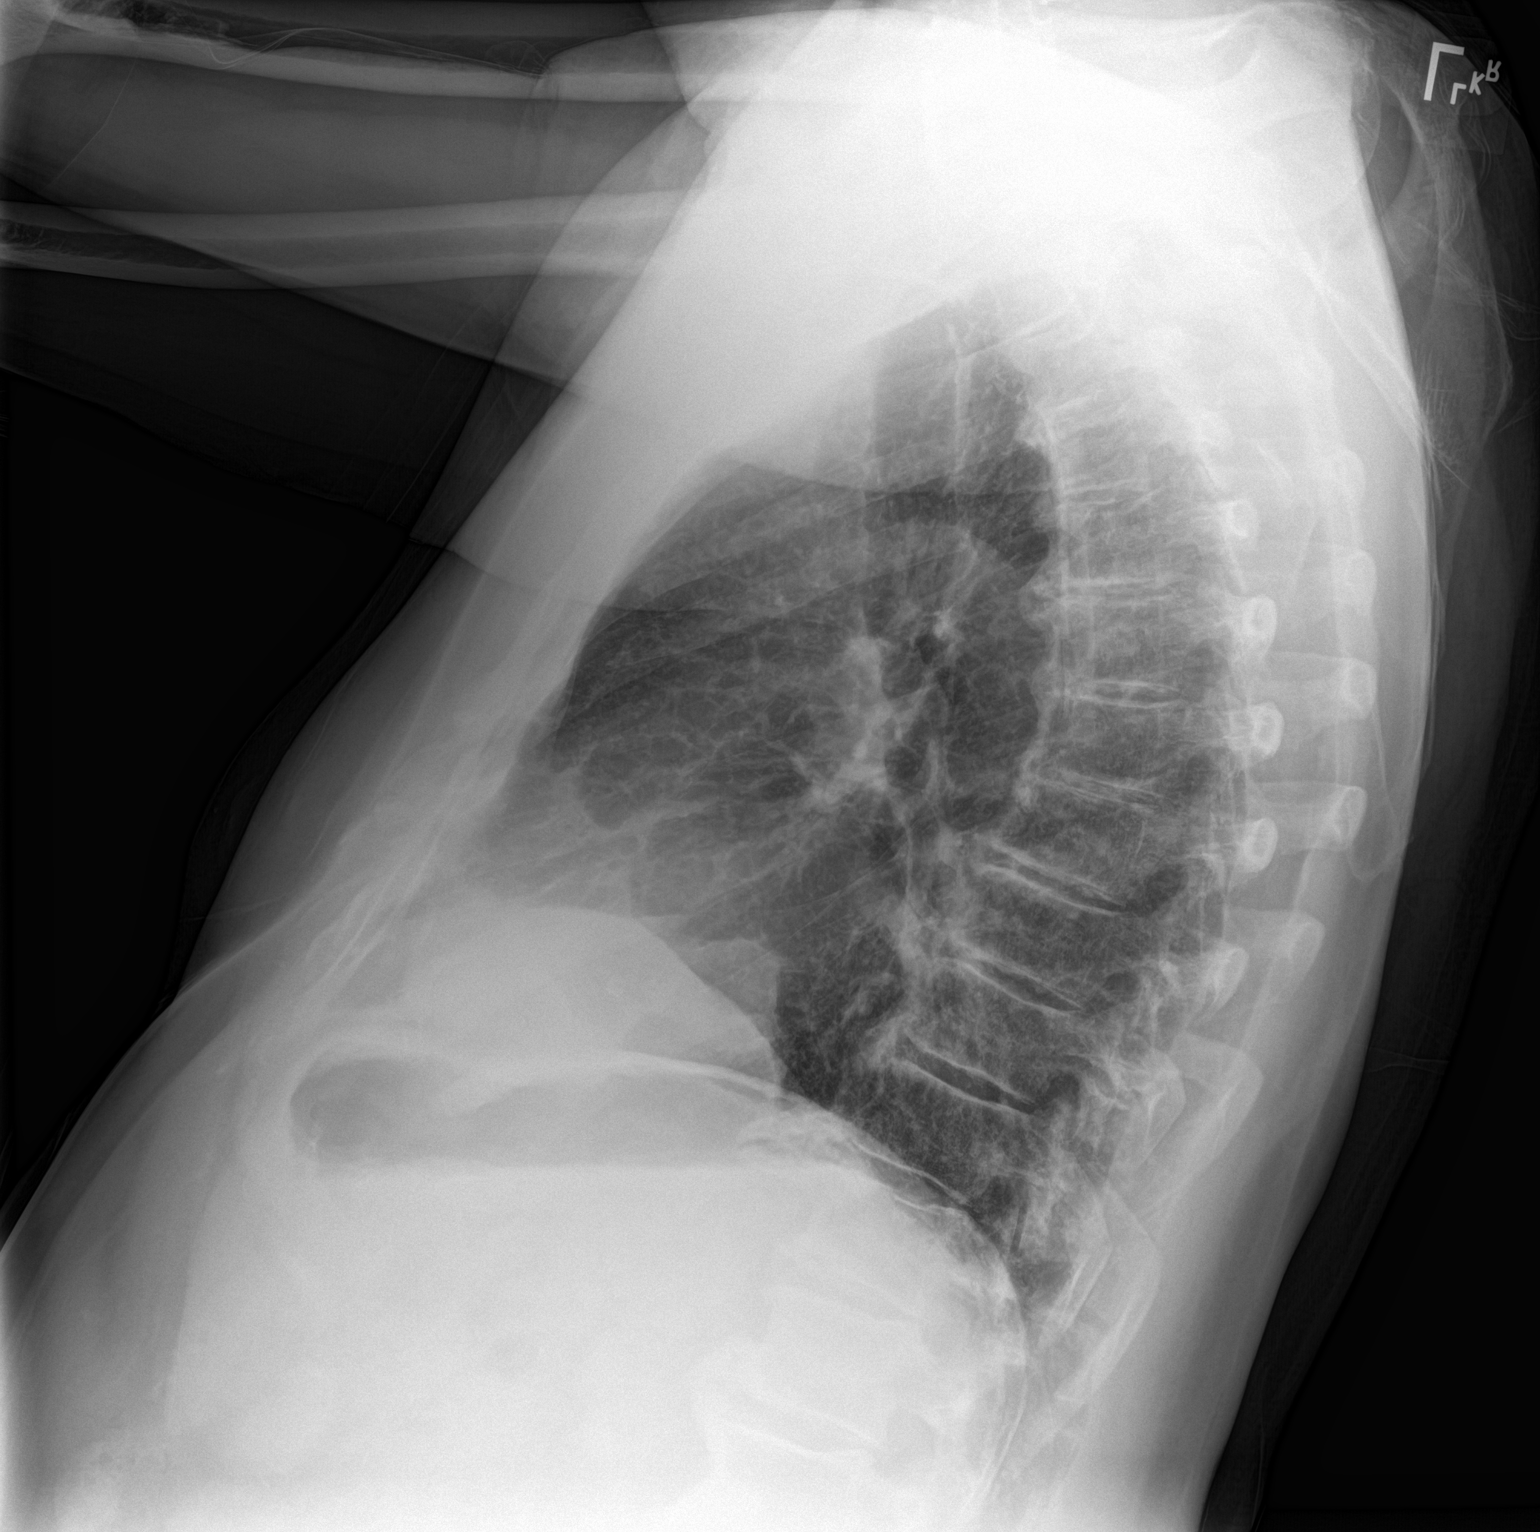

[2 of 2 positions shown; findings below may reference images not displayed]

FINDINGS: Thoracic spondylosis. Lower cervical plate and screw fixator.
Emphysema. Subsegmental atelectasis or scarring in the lingula.

Lungs appear otherwise clear. Heart size within normal limits. No
pleural effusion.
IMPRESSION: 1. Emphysema.
2. Scarring or subsegmental atelectasis at the left lung base.
3. Thoracic spondylosis.

## 2017-09-01 ENCOUNTER — Ambulatory Visit: Payer: Medicare HMO | Admitting: Anesthesiology

## 2017-09-01 ENCOUNTER — Encounter
Admission: RE | Disposition: A | Discharge status: Home / Self Care | Payer: Self-pay | Source: Ambulatory Visit | Attending: Unknown Physician Specialty

## 2017-09-01 ENCOUNTER — Encounter: Payer: Self-pay | Admitting: Anesthesiology

## 2017-09-01 ENCOUNTER — Encounter: Admission: RE | Payer: Self-pay | Source: Ambulatory Visit

## 2017-09-01 ENCOUNTER — Ambulatory Visit
Admission: RE | Admit: 2017-09-01 | Discharge: 2017-09-01 | Disposition: A | Discharge status: Home / Self Care | Payer: Medicare HMO | Source: Ambulatory Visit | Attending: Unknown Physician Specialty | Admitting: Unknown Physician Specialty

## 2017-09-01 DIAGNOSIS — D122 Benign neoplasm of ascending colon: Secondary | ICD-10-CM | POA: Diagnosis not present

## 2017-09-01 DIAGNOSIS — K5521 Angiodysplasia of colon with hemorrhage: Secondary | ICD-10-CM | POA: Insufficient documentation

## 2017-09-01 DIAGNOSIS — K648 Other hemorrhoids: Secondary | ICD-10-CM | POA: Diagnosis not present

## 2017-09-01 DIAGNOSIS — Z1211 Encounter for screening for malignant neoplasm of colon: Secondary | ICD-10-CM | POA: Insufficient documentation

## 2017-09-01 DIAGNOSIS — K635 Polyp of colon: Secondary | ICD-10-CM | POA: Diagnosis not present

## 2017-09-01 DIAGNOSIS — Z8371 Family history of colonic polyps: Secondary | ICD-10-CM | POA: Diagnosis not present

## 2017-09-01 DIAGNOSIS — Z86010 Personal history of colonic polyps: Secondary | ICD-10-CM | POA: Diagnosis not present

## 2017-09-01 DIAGNOSIS — K5730 Diverticulosis of large intestine without perforation or abscess without bleeding: Secondary | ICD-10-CM | POA: Diagnosis not present

## 2017-09-01 DIAGNOSIS — Z87891 Personal history of nicotine dependence: Secondary | ICD-10-CM | POA: Diagnosis not present

## 2017-09-01 DIAGNOSIS — K5520 Angiodysplasia of colon without hemorrhage: Secondary | ICD-10-CM | POA: Diagnosis not present

## 2017-09-01 DIAGNOSIS — K5790 Diverticulosis of intestine, part unspecified, without perforation or abscess without bleeding: Secondary | ICD-10-CM | POA: Diagnosis not present

## 2017-09-01 DIAGNOSIS — D126 Benign neoplasm of colon, unspecified: Secondary | ICD-10-CM | POA: Diagnosis not present

## 2017-09-01 DIAGNOSIS — Z7982 Long term (current) use of aspirin: Secondary | ICD-10-CM | POA: Insufficient documentation

## 2017-09-01 DIAGNOSIS — Q2733 Arteriovenous malformation of digestive system vessel: Secondary | ICD-10-CM | POA: Diagnosis not present

## 2017-09-01 DIAGNOSIS — N400 Enlarged prostate without lower urinary tract symptoms: Secondary | ICD-10-CM | POA: Insufficient documentation

## 2017-09-01 HISTORY — PX: COLONOSCOPY WITH PROPOFOL: SHX5780

## 2017-09-01 SURGERY — COLONOSCOPY WITH PROPOFOL
Anesthesia: General

## 2017-09-01 MED ORDER — SODIUM CHLORIDE 0.9 % IV SOLN: INTRAVENOUS | Status: DC

## 2017-09-01 MED ORDER — MIDAZOLAM HCL 2 MG/2ML IJ SOLN
INTRAMUSCULAR | Status: AC
  Filled 2017-09-01: qty 2

## 2017-09-01 MED ORDER — PROPOFOL 10 MG/ML IV BOLUS
INTRAVENOUS | Status: DC | PRN
  Administered 2017-09-01: 20 mg via INTRAVENOUS
  Administered 2017-09-01: 10 mg via INTRAVENOUS
  Administered 2017-09-01: 20 mg via INTRAVENOUS

## 2017-09-01 MED ORDER — MIDAZOLAM HCL 5 MG/5ML IJ SOLN
INTRAMUSCULAR | Status: DC | PRN
  Administered 2017-09-01: 2 mg via INTRAVENOUS

## 2017-09-01 MED ORDER — LIDOCAINE HCL (PF) 2 % IJ SOLN
INTRAMUSCULAR | Status: AC
  Filled 2017-09-01: qty 10

## 2017-09-01 MED ORDER — LIDOCAINE HCL (PF) 2 % IJ SOLN
INTRAMUSCULAR | Status: DC | PRN
  Administered 2017-09-01: 80 mg

## 2017-09-01 MED ORDER — PROPOFOL 500 MG/50ML IV EMUL
INTRAVENOUS | Status: DC | PRN
  Administered 2017-09-01: 50 ug/kg/min via INTRAVENOUS

## 2017-09-01 MED ORDER — SODIUM CHLORIDE 0.9 % IV SOLN
INTRAVENOUS | Status: DC
  Administered 2017-09-01: 1000 mL via INTRAVENOUS
  Administered 2017-09-01: 10:00:00 via INTRAVENOUS

## 2017-09-01 MED ORDER — FENTANYL CITRATE (PF) 100 MCG/2ML IJ SOLN
INTRAMUSCULAR | Status: AC
  Filled 2017-09-01: qty 2

## 2017-09-01 MED ORDER — FENTANYL CITRATE (PF) 100 MCG/2ML IJ SOLN
INTRAMUSCULAR | Status: DC | PRN
  Administered 2017-09-01 (×4): 25 ug via INTRAVENOUS

## 2017-09-01 NOTE — H&P (Signed)
Primary Care Physician:  Alanson Aly, [place] Primary Gastroenterologist:  Dr. Vira Agar  Pre-Procedure History & Physical: HPI:  George Davis is a 72 y.o. male is here for an colonoscopy.  PH colon polyps.   Past Medical History:  Diagnosis Date  . Arthritis    fingers  . BPH (benign prostatic hyperplasia)   . Diverticulosis   . Environmental and seasonal allergies   . Hyperlipidemia   . Pneumonia    HISTORY    Past Surgical History:  Procedure Laterality Date  . BACK SURGERY    . CARPAL TUNNEL RELEASE Left 03/08/2017   Procedure: CARPAL TUNNEL RELEASE;  Surgeon: Leanor Kail, MD;  Location: [facility] ORS;  Service: Orthopedics;  Laterality: Left;  . CATARACT EXTRACTION W/PHACO Left 07/07/2015   Procedure: CATARACT EXTRACTION PHACO AND INTRAOCULAR LENS PLACEMENT (IOC);  Surgeon: Birder Robson, MD;  Location: [facility] ORS;  Service: Ophthalmology;  Laterality: Left;  [place] 00:33.8 AP% 20.7 CDE 7.01 fluid pack lot #[id number]  . CATARACT EXTRACTION W/PHACO Right 08/11/2015   Procedure: CATARACT EXTRACTION PHACO AND INTRAOCULAR LENS PLACEMENT ([place]);  Surgeon: Birder Robson, MD;  Location: [facility] ORS;  Service: Ophthalmology;  Laterality: Right;  [place] 49.0 AP% 17.8 CDE 8.73 Fluid Pack lot # [id number]  . COLONOSCOPY W/ POLYPECTOMY  01/03/2003   12/04/2008, 01/01/2014  . HERNIA REPAIR Right 1975   inguinal, left in 1980  . LAMINOTOMY / EXCISION DISK POSTERIOR CERVICAL SPINE  2011   metal in neck  . RCR    . SHOULDER SURGERY Right 1990   left done 1985  . SIGMOIDOSCOPY    . TONSILLECTOMY      Prior to Admission medications   Medication Sig Start Date End Date Taking? Authorizing Provider  aspirin 81 MG tablet Take 81 mg by mouth daily.    [provider]  fluticasone (FLONASE) 50 MCG/ACT nasal spray Place 1 spray into both nostrils 2 (two) times daily. Patient taking differently: Place 2 sprays into both nostrils 2 (two) times daily.  12/29/14 03/08/17  Betancourt, Aura Fey,  NP  tamsulosin (FLOMAX) 0.4 MG CAPS capsule Take 0.4 mg by mouth daily.    [provider]    Allergies as of 06/13/2017  . (No Known Allergies)    Family History  Problem Relation Age of Onset  . Stomach cancer Mother   . Prostate cancer Father   . Diabetes Father   . Bladder Cancer Neg Hx   . Kidney cancer Neg Hx     Social History   Socioeconomic History  . Marital status: Widowed    Spouse name: Not on file  . Number of children: Not on file  . Years of education: Not on file  . Highest education level: Not on file  Occupational History  . Not on file  Social Needs  . Financial resource strain: Not on file  . Food insecurity:    Worry: Not on file    Inability: Not on file  . Transportation needs:    Medical: Not on file    Non-medical: Not on file  Tobacco Use  . Smoking status: Former Smoker    Packs/day: 1.00    Years: 28.00    Pack years: 28.00    Types: Cigarettes    Last attempt to quit: 09/18/1994    Years since quitting: 22.9  . Smokeless tobacco: Never Used  Substance and Sexual Activity  . Alcohol use: Yes    Alcohol/week: 2.0 standard drinks    Types: 2  Glasses of wine per week    Comment: occasionally  . Drug use: Never  . Sexual activity: Not on file  Lifestyle  . Physical activity:    Days per week: Not on file    Minutes per session: Not on file  . Stress: Not on file  Relationships  . Social connections:    Talks on phone: Not on file    Gets together: Not on file    Attends religious service: Not on file    Active member of club or organization: Not on file    Attends meetings of clubs or organizations: Not on file    Relationship status: Not on file  . Intimate partner violence:    Fear of current or ex partner: Not on file    Emotionally abused: Not on file    Physically abused: Not on file    Forced sexual activity: Not on file  Other Topics Concern  . Not on file  Social History Narrative  . Not on file     Review of Systems: See HPI, otherwise negative ROS  Physical Exam: BP 110/80   Pulse 79   Temp 97.6 F (36.4 C) (Tympanic)   Resp 18   Ht 5\' 8"  (1.727 m)   Wt 83.9 kg   SpO2 98%   BMI 28.13 kg/m  General:   Alert,  pleasant and cooperative in NAD Head:  Normocephalic and atraumatic. Neck:  Supple; no masses or thyromegaly. Lungs:  Clear throughout to auscultation.    Heart:  Regular rate and rhythm. Abdomen:  Soft, nontender and nondistended. Normal bowel sounds, without guarding, and without rebound.   Neurologic:  Alert and  oriented x4;  grossly normal neurologically.  Impression/Plan: George Davis is here for an colonoscopy to be performed for [facility] colon polyps.  Risks, benefits, limitations, and alternatives regarding  colonoscopy have been reviewed with the patient.  Questions have been answered.  All parties agreeable.   Gaylyn Cheers, MD  09/01/2017, 9:23 AM

## 2017-09-01 NOTE — Anesthesia Preprocedure Evaluation (Signed)
Anesthesia Evaluation  Patient identified by MRN, date of birth, ID band Patient awake    Reviewed: Allergy & Precautions, H&P , NPO status , Patient's Chart, lab work & pertinent test results, reviewed documented beta blocker date and time   Airway Mallampati: II  TM Distance: >3 FB Neck ROM: full    Dental  (+) Teeth Intact   Pulmonary neg pulmonary ROS, pneumonia, resolved, former smoker,    Pulmonary exam normal        Cardiovascular Exercise Tolerance: Good negative cardio ROS Normal cardiovascular exam Rate:Normal     Neuro/Psych negative neurological ROS  negative psych ROS   GI/Hepatic Neg liver ROS, diverticulosis   Endo/Other  negative endocrine ROS  Renal/GU negative Renal ROS  negative genitourinary   Musculoskeletal  (+) Arthritis , Osteoarthritis,    Abdominal   Peds  Hematology negative hematology ROS (+)   Anesthesia Other Findings   Reproductive/Obstetrics negative OB ROS                             Anesthesia Physical  Anesthesia Plan  ASA: II  Anesthesia Plan: General LMA   Post-op Pain Management:    Induction:   PONV Risk Score and Plan: 4 or greater and 3  Airway Management Planned:   Additional Equipment:   Intra-op Plan:   Post-operative Plan:   Informed Consent: I have reviewed the patients History and Physical, chart, labs and discussed the procedure including the risks, benefits and alternatives for the proposed anesthesia with the patient or authorized representative who has indicated his/her understanding and acceptance.     Plan Discussed with: CRNA  Anesthesia Plan Comments:         Anesthesia Quick Evaluation

## 2017-09-01 NOTE — Anesthesia Post-op Follow-up Note (Signed)
Anesthesia QCDR form completed.        

## 2017-09-01 NOTE — Op Note (Signed)
[facility] Gastroenterology Patient Name: George Davis Procedure Date: 09/01/2017 9:15 AM MRN: [id number] Account #: [id number] Date of Birth: [birth year] Admit Type: Outpatient Age: 72 Room: [facility] ENDO ROOM 1 Gender: Male Note Status: Finalized Procedure:            Colonoscopy Indications:          High risk colon cancer surveillance: Personal history                        of colonic polyps Providers:            Manya Silvas, MD Referring MD:         No Local Md, MD (Referring MD) Medicines:            Propofol per Anesthesia Complications:        No immediate complications. Procedure:            Pre-Anesthesia Assessment:                       - After reviewing the risks and benefits, the patient                        was deemed in satisfactory condition to undergo the                        procedure.                       After obtaining informed consent, the colonoscope was                        passed under direct vision. Throughout the procedure,                        the patient's blood pressure, pulse, and oxygen                        saturations were monitored continuously. The                        Colonoscope was introduced through the anus and                        advanced to the the cecum, identified by appendiceal                        orifice and ileocecal valve. The colonoscopy was                        performed without difficulty. The patient tolerated the                        procedure well. The quality of the bowel preparation                        was adequate to identify polyps. Findings:      Two sessile polyps were found in the ascending colon. The polyps were       diminutive in size. These polyps were removed with a jumbo cold forceps.       Resection and retrieval were complete.  A single small localized angioectasia with bleeding was found in the       proximal ascending colon. Coagulation for tissue destruction  using argon       plasma at 0.4 liters/minute and 20 watts was successful. To prevent       bleeding post-intervention, one hemostatic clip was successfully placed.       There was no bleeding at the end of the procedure.      Multiple small and large-mouthed diverticula were found in the sigmoid       colon, descending colon, transverse colon and ascending colon.      The exam was otherwise without abnormality. Impression:           - Two diminutive polyps in the ascending colon, removed                        with a jumbo cold forceps. Resected and retrieved.                       - A single bleeding colonic angioectasia. Treated with                        argon plasma coagulation (APC). Clip was placed.                       - Diverticulosis in the sigmoid colon, in the                        descending colon, in the transverse colon and in the                        ascending colon.                       - The examination was otherwise normal. Recommendation:       - Await pathology results. Manya Silvas, MD 09/01/2017 10:08:57 AM This report has been signed electronically. Number of Addenda: 0 Note Initiated On: 09/01/2017 9:15 AM Scope Withdrawal Time: 0 hours 13 minutes 20 seconds  Total Procedure Duration: 0 hours 36 minutes 9 seconds       [facility]

## 2017-09-01 NOTE — Transfer of Care (Signed)
Immediate Anesthesia Transfer of Care Note  Patient: George Davis  Procedure(s) Performed: COLONOSCOPY WITH PROPOFOL (N/A )  Patient Location: PACU  Anesthesia Type:General  Level of Consciousness: awake, alert  and oriented  Airway & Oxygen Therapy: Patient Spontanous Breathing  Post-op Assessment: Report given to RN and Post -op Vital signs reviewed and stable  Post vital signs: Reviewed and stable  Last Vitals:  Vitals Value Taken Time  BP    Temp    Pulse    Resp    SpO2      Last Pain:  Vitals:   09/01/17 0853  TempSrc: Tympanic  PainSc: 0-No pain         Complications: No apparent anesthesia complications

## 2017-09-02 NOTE — Anesthesia Postprocedure Evaluation (Signed)
Anesthesia Post Note  Patient: George Davis  Procedure(s) Performed: COLONOSCOPY WITH PROPOFOL (N/A )  Patient location during evaluation: PACU Anesthesia Type: General Level of consciousness: awake and alert and oriented Pain management: pain level controlled Vital Signs Assessment: post-procedure vital signs reviewed and stable Respiratory status: spontaneous breathing Cardiovascular status: blood pressure returned to baseline Anesthetic complications: no     Last Vitals:  Vitals:   09/01/17 1030 09/01/17 1046  BP: 136/89 135/88  Pulse: 66 64  Resp: 20 18  Temp:    SpO2: 100% 100%    Last Pain:  Vitals:   09/01/17 1046  TempSrc:   PainSc: 0-No pain                 Shaquanna Lycan

## 2017-09-04 ENCOUNTER — Encounter: Payer: Self-pay | Admitting: Unknown Physician Specialty

## 2017-09-04 LAB — SURGICAL PATHOLOGY

## 2017-11-02 DIAGNOSIS — Z23 Encounter for immunization: Secondary | ICD-10-CM | POA: Diagnosis not present

## 2018-04-25 DIAGNOSIS — R42 Dizziness and giddiness: Secondary | ICD-10-CM | POA: Diagnosis not present

## 2018-04-25 DIAGNOSIS — J309 Allergic rhinitis, unspecified: Secondary | ICD-10-CM | POA: Diagnosis not present

## 2018-04-25 DIAGNOSIS — N401 Enlarged prostate with lower urinary tract symptoms: Secondary | ICD-10-CM | POA: Diagnosis not present

## 2018-04-25 DIAGNOSIS — Z79899 Other long term (current) drug therapy: Secondary | ICD-10-CM | POA: Diagnosis not present

## 2018-04-25 DIAGNOSIS — Z125 Encounter for screening for malignant neoplasm of prostate: Secondary | ICD-10-CM | POA: Diagnosis not present

## 2018-04-25 DIAGNOSIS — R3916 Straining to void: Secondary | ICD-10-CM | POA: Diagnosis not present

## 2018-04-25 DIAGNOSIS — Z0000 Encounter for general adult medical examination without abnormal findings: Secondary | ICD-10-CM | POA: Diagnosis not present

## 2018-05-23 ENCOUNTER — Other Ambulatory Visit: Payer: Self-pay | Admitting: *Deleted

## 2018-05-23 DIAGNOSIS — R972 Elevated prostate specific antigen [PSA]: Secondary | ICD-10-CM

## 2018-05-23 NOTE — Progress Notes (Signed)
PSA

## 2018-05-24 ENCOUNTER — Other Ambulatory Visit: Payer: Medicare HMO

## 2018-05-24 ENCOUNTER — Other Ambulatory Visit: Payer: Self-pay

## 2018-05-24 DIAGNOSIS — R972 Elevated prostate specific antigen [PSA]: Secondary | ICD-10-CM | POA: Diagnosis not present

## 2018-05-25 LAB — PSA: Prostate Specific Ag, Serum: 2.8 ng/mL (ref 0.0–4.0)

## 2018-05-29 ENCOUNTER — Other Ambulatory Visit: Payer: Self-pay

## 2018-05-29 ENCOUNTER — Telehealth (INDEPENDENT_AMBULATORY_CARE_PROVIDER_SITE_OTHER): Payer: Medicare HMO | Admitting: Urology

## 2018-05-29 ENCOUNTER — Ambulatory Visit: Payer: Self-pay | Admitting: Urology

## 2018-05-29 DIAGNOSIS — N138 Other obstructive and reflux uropathy: Secondary | ICD-10-CM

## 2018-05-29 DIAGNOSIS — R972 Elevated prostate specific antigen [PSA]: Secondary | ICD-10-CM

## 2018-05-29 NOTE — Progress Notes (Signed)
Virtual Visit via Video Note  I connected with George Davis on 05/29/18 at  9:30 AM EDT by a video enabled telemedicine application and verified that I am speaking with the correct person using two identifiers.  Location: Patient: home Provider: home   I discussed the limitations of evaluation and management by telemedicine and the availability of in person appointments. The patient expressed understanding and agreed to proceed.  History of Present Illness: 73 year old male with history of elevated PSA who returns today for routine follow up.  Personal history of rising PSA (peak 3.4 2018) which has subsequently trended back down over the past two year.  Father dx with prostate cancer in his 84s which was removed.  He did not die of prostate cancer.    Personal history of BPH with obstructive urinary symptoms on flomax.  He was recently switched to a new BPH med briefly by his PCP after complaining of "ringing in the ears and the room spinning".  He reports that his medication did not make the vertigo issue go away and his urinary symptoms including weak stream recurred.  He stopped this other medication and resumed flomax and his urinary symptoms improved again dramatically.    On the medication, he denies weak stream, sensation on incomplete bladder emptying, nocturia, dysuria, urgency or frequency.  He is happy on the meds.  No orthostasis or dizziness when standing.    He his relatively healthy, takes 81 mg ASA daily.    PSA trend: 1.78  09/2011 2.52 10/2012 2.1 01/2013 2.61 01/2014 2.67 02/2015 3.4 02/2016 2.4 05/2017 2.8  05/2018   Observations/Objective: Appears well, pleasant, interactive, no actute distress   Assessment and Plan:  1. Elevated PSA PSA stable, appropriate for age Discussed PSA screening guidelines, interested in continue annual PSA screening until 75 in light of father's personal history of prostate cancer F/u 1 year for PSA/ DRE   2. BPH with urinary  obstruction Symptoms stable on flomax- continue Discussed difference between vertigo (uncommon with flomax) and othostatis (very common with Flomax), believes his symptoms are not related to medication which  Discussed surgical intervention as option, no interested  Follow Up Instructions: F/u 1 year with PSA/  DRE/ IPSS/ PVR   I discussed the assessment and treatment plan with the patient. The patient was provided an opportunity to ask questions and all were answered. The patient agreed with the plan and demonstrated an understanding of the instructions.   The patient was advised to call back or seek an in-person evaluation if the symptoms worsen or if the condition fails to improve as anticipated.  I provided 13 minutes of non-face-to-face time during this encounter.   Hollice Espy, MD

## 2018-06-17 ENCOUNTER — Ambulatory Visit
Admission: EM | Admit: 2018-06-17 | Discharge: 2018-06-17 | Disposition: A | Discharge status: Home / Self Care | Payer: Medicare HMO | Attending: Urgent Care | Admitting: Urgent Care

## 2018-06-17 DIAGNOSIS — L03113 Cellulitis of right upper limb: Secondary | ICD-10-CM | POA: Diagnosis not present

## 2018-06-17 MED ORDER — CLINDAMYCIN HCL 150 MG PO CAPS: 150.0000 mg | ORAL_CAPSULE | Freq: Four times a day (QID) | ORAL | 0 refills | Status: DC

## 2018-06-17 MED ORDER — PREDNISONE 10 MG (21) PO TBPK: ORAL_TABLET | Freq: Every day | ORAL | 0 refills | Status: DC

## 2018-06-17 NOTE — ED Triage Notes (Signed)
Pt woke up yesterday morning with his right hand swollen. Did take benadryl without relief and states it is painful. Unsure if anything bit him. Is red, swollen, and warm to the touch but not itchy.

## 2018-06-17 NOTE — Discharge Instructions (Signed)
°  Review of Systems : n/a Physical Exam : n/a Procedures: n/a  It was very nice meeting you today in clinic. Thank you for entrusting me with your care.   As discussed, you have cellulitis in your hand. Please utilize the medications that we discussed. Your prescriptions have been called in to your pharmacy. Use ice to help with the swelling and elevate.   Make arrangements to follow up with your regular doctor in 1 week for re-evaluation. If your symptoms/condition worsens, please seek follow up care either here or in the ER. Please remember, our [place] providers are "right here with you" when you need [place].   Again, it was my pleasure to take care of you today. Thank you for choosing our clinic. I hope that you start to feel better quickly.   Honor Loh, MSN, APRN, FNP-C, CEN Advanced Practice Provider [facility]

## 2018-06-17 NOTE — ED Provider Notes (Addendum)
[address] [place], [place] [address] [phone]   Name: George Davis DOB: 02/13/1945 MRN: [id number] CSN: [id number] PCP: Sofie Hartigan, MD  Arrival date and time:  06/17/18 1343  Chief Complaint:  Hand Pain  NOTE: Prior to seeing the patient today, I have reviewed the triage nursing documentation and vital signs. Clinical staff has updated patient's PMH/PSHx, current medication list, and drug allergies/intolerances to ensure comprehensive history available to assist in medical decision making.   History:   HPI: DADE RODIN is a 74 y.o. male who presents today with complaints of pain and swelling to his RIGHT hand that began with acute onset on 06/16/2018.  Patient denies injury or known insect bite.  Patient advising that he has been outside a lot pressure washing.  Patient woke yesterday morning with redness and swelling to the dorsal aspect of his RIGHT hand.  He notes that pain increases with flexion and extension of his fingers.  No drainage or lymphangitic spread has been appreciated by patient.  He has not experienced any associated fevers.  Past Medical History:  Diagnosis Date  . Arthritis    fingers  . BPH (benign prostatic hyperplasia)   . Diverticulosis   . Environmental and seasonal allergies   . Hyperlipidemia   . Pneumonia    HISTORY    Past Surgical History:  Procedure Laterality Date  . BACK SURGERY    . CARPAL TUNNEL RELEASE Left 03/08/2017   Procedure: CARPAL TUNNEL RELEASE;  Surgeon: Leanor Kail, MD;  Location: [facility] ORS;  Service: Orthopedics;  Laterality: Left;  . CATARACT EXTRACTION W/PHACO Left 07/07/2015   Procedure: CATARACT EXTRACTION PHACO AND INTRAOCULAR LENS PLACEMENT (IOC);  Surgeon: Birder Robson, MD;  Location: [facility] ORS;  Service: Ophthalmology;  Laterality: Left;  [place] 00:33.8 AP% 20.7 CDE 7.01 fluid pack lot #[id number]  . CATARACT EXTRACTION W/PHACO Right 08/11/2015   Procedure: CATARACT EXTRACTION PHACO AND  INTRAOCULAR LENS PLACEMENT ([place]);  Surgeon: Birder Robson, MD;  Location: [facility] ORS;  Service: Ophthalmology;  Laterality: Right;  [place] 49.0 AP% 17.8 CDE 8.73 Fluid Pack lot # [id number]  . COLONOSCOPY W/ POLYPECTOMY  01/03/2003   12/04/2008, 01/01/2014  . COLONOSCOPY WITH PROPOFOL N/A 09/01/2017   Procedure: COLONOSCOPY WITH PROPOFOL;  Surgeon: Manya Silvas, MD;  Location: [facility] ENDOSCOPY;  Service: Endoscopy;  Laterality: N/A;  . HERNIA REPAIR Right 1975   inguinal, left in 1980  . LAMINOTOMY / EXCISION DISK POSTERIOR CERVICAL SPINE  2011   metal in neck  . RCR    . SHOULDER SURGERY Right 1990   left done 1985  . SIGMOIDOSCOPY    . TONSILLECTOMY      Family History  Problem Relation Age of Onset  . Stomach cancer Mother   . Prostate cancer Father   . Diabetes Father   . Bladder Cancer Neg Hx   . Kidney cancer Neg Hx     Social History   Socioeconomic History  . Marital status: Widowed    Spouse name: Not on file  . Number of children: Not on file  . Years of education: Not on file  . Highest education level: Not on file  Occupational History  . Not on file  Social Needs  . Financial resource strain: Not on file  . Food insecurity:    Worry: Not on file    Inability: Not on file  . Transportation needs:    Medical: Not on file    Non-medical: Not on file  Tobacco  Use  . Smoking status: Former Smoker    Packs/day: 1.00    Years: 28.00    Pack years: 28.00    Types: Cigarettes    Last attempt to quit: 09/18/1994    Years since quitting: 23.7  . Smokeless tobacco: Never Used  Substance and Sexual Activity  . Alcohol use: Yes    Alcohol/week: 2.0 standard drinks    Types: 2 Glasses of wine per week    Comment: occasionally  . Drug use: Never  . Sexual activity: Not on file  Lifestyle  . Physical activity:    Days per week: Not on file    Minutes per session: Not on file  . Stress: Not on file  Relationships  . Social connections:    Talks on phone: Not  on file    Gets together: Not on file    Attends religious service: Not on file    Active member of club or organization: Not on file    Attends meetings of clubs or organizations: Not on file    Relationship status: Not on file  . Intimate partner violence:    Fear of current or ex partner: Not on file    Emotionally abused: Not on file    Physically abused: Not on file    Forced sexual activity: Not on file  Other Topics Concern  . Not on file  Social History Narrative  . Not on file    There are no active problems to display for this patient.   Home Medications:    Current Meds  Medication Sig  . aspirin 81 MG tablet Take 81 mg by mouth daily.  . tamsulosin (FLOMAX) 0.4 MG CAPS capsule Take 0.4 mg by mouth daily.    Allergies:   Patient has no known allergies.  Review of Systems (ROS): Review of Systems  Constitutional: Negative for chills and fever.  Respiratory: Negative for cough and shortness of breath.   Cardiovascular: Negative for chest pain and palpitations.  Musculoskeletal: Positive for joint swelling.       Acute pain and redness to RIGHT hand since 06/16/2018  Skin: Positive for color change (RIGHT hand). Negative for rash and wound.  Neurological: Negative for dizziness, weakness, numbness and headaches.  Hematological: Negative for adenopathy.     Physical Exam:  Triage Vital Signs ED Triage Vitals  Enc Vitals Group     BP 06/17/18 1357 139/78     Pulse Rate 06/17/18 1357 76     Resp 06/17/18 1357 18     Temp 06/17/18 1357 98.2 F (36.8 C)     Temp Source 06/17/18 1357 Oral     SpO2 06/17/18 1357 99 %     Weight 06/17/18 1359 190 lb (86.2 kg)     Height 06/17/18 1359 5\' 8"  (1.727 m)     Head Circumference --      Peak Flow --      Pain Score 06/17/18 1359 5     Pain Loc --      Pain Edu? --      Excl. in [place]? --     Physical Exam  Constitutional: He is oriented to person, place, and time and well-developed, well-nourished, and in no  distress.  HENT:  Head: Normocephalic and atraumatic.  Mouth/Throat: Mucous membranes are normal.  Neck: Normal range of motion. Neck supple. No tracheal deviation present.  Cardiovascular: Normal rate, regular rhythm, normal heart sounds and intact distal pulses. Exam reveals no gallop and  no friction rub.  No murmur heard. Pulmonary/Chest: Effort normal and breath sounds normal. No respiratory distress. He has no wheezes. He has no rales.  Musculoskeletal:     Right hand: He exhibits decreased range of motion (painful flexion and extension of fingers), tenderness and swelling. He exhibits normal two-point discrimination, normal capillary refill and no laceration. Normal sensation noted. Normal strength noted.       Hands:  Lymphadenopathy:    He has no cervical adenopathy.  Neurological: He is alert and oriented to person, place, and time.  Skin: Skin is warm and dry. No rash noted. No erythema.  Psychiatric: Mood, affect and judgment normal.  Nursing note and vitals reviewed.     Urgent Care Treatments / Results:   LABS: PLEASE NOTE: all labs that were ordered this encounter are listed, however only abnormal results are displayed. Labs Reviewed - No data to display  EKG: -None  RADIOLOGY: No results found.  PRODEDURES: Procedures  MEDICATIONS RECEIVED THIS VISIT: Medications - No data to display  PERTINENT CLINICAL COURSE NOTES/UPDATES: No data to display   Initial Impression / Assessment and Plan / Urgent Care Course:    ZYHIR CAPPELLA is a 74 y.o. male who presents to [facility] today with complaints of Hand Pain  Pertinent labs & imaging results that were available during my care of the patient were personally reviewed by me and considered in my medical decision making (see lab/imaging section of note for values and interpretations).  Exam reveals pain, erythema, and swelling to the dorsal aspect of the RIGHT hand. Patient denies injury, however there was  a Band-Aid noted to the distal aspect of the finger. Patient recalls minor skin trauma that he describes as "like a paper cut" to his finger.  Sensory and motor function intact in the hand. Capillary refill normal.  Given the degree of edema and associated erythema, there is concern for cellulitis.  Etiology likely related to insect bite, however this is uncertain at this point.  Discussed treatment with systemic steroids and antibiotics.  Will prescribe patient a 10-day course of cephalexin 150 mg to be taken 4 times a day, in addition to a 6-day prednisone taper.  Patient was encouraged to utilize ibuprofen and/or Tylenol as needed for pain.  May apply ice/heat for comfort.  Encouraged to elevate hand to help with associated swelling.  I have reviewed the follow up and strict return precautions for any new or worsening symptoms. Patient educated on the signs and symptoms of infection including increased swelling, redness, pain, drainage, lymphangitic spread, and the development of a fever.  We discussed at length that any increase in symptoms should be reevaluated either here, the ED, or by his primary care physician to prevent infection that could lead to hospitalization.  Patient is aware of symptoms that would be deemed urgent/emergent, and would thus require further evaluation either here or in the emergency department. At the time of discharge, he verbalized understanding and consent with the discharge plan as it was reviewed with him. All questions were fielded by provider and/or clinic staff prior to patient discharge.    Final Clinical Impressions(s) / Urgent Care Diagnoses:   Final diagnoses:  Cellulitis of hand, right    New Prescriptions:   Meds ordered this encounter  Medications  . clindamycin (CLEOCIN) 150 MG capsule    Sig: Take 1 capsule (150 mg total) by mouth every 6 (six) hours.    Dispense:  40 capsule  Refill:  0  . predniSONE (STERAPRED UNI-PAK 21 TAB) 10 MG (21) TBPK  tablet    Sig: Take by mouth daily. 60 mg x 1 day, 50 mg x 1 day, 40 mg x 1 day, 30 mg x 1 day, 20 mg x 1 day, 10 mg x 1 day    Dispense:  21 tablet    Refill:  0    Controlled Substance Prescriptions:  [place] Controlled Substance Registry consulted? Not Applicable  NOTE: This note was prepared using Dragon dictation software along with smaller phrase technology. Despite my best ability to proofread, there is the potential that transcriptional errors may still occur from this process, and are completely unintentional.    Karen Kitchens, NP 06/20/18 1432

## 2018-06-19 ENCOUNTER — Telehealth: Payer: Self-pay | Admitting: Urgent Care

## 2018-06-19 NOTE — Telephone Encounter (Signed)
  [address], [place], [place] [address] [phone]   Date: 06/19/2018  Name: George Davis DOB: September 20, 1945 MRN: [id number]  Re: Clinical follow up  Call placed to patient to reassess clinical condition following visit to [facility] on 06/17/2018.  Patient diagnosed with cellulitis secondary to unknown etiology in his RIGHT hand.  Patient was prescribed a 10-day course of cephalexin, in addition to a course of systemic steroids.  I wanted to check and see if patient was improving with the clinical interventions in place, or if we need to see him back in clinic for reassessment.  There was no answer at the patient's home; [facility] asked patient to return call to the clinic.   Honor Loh, MSN, APRN, FNP-C, CEN Advanced Practice Provider [facility] 06/19/2018 9:39 AM

## 2018-06-20 ENCOUNTER — Telehealth: Payer: Self-pay | Admitting: Emergency Medicine

## 2018-06-20 NOTE — Telephone Encounter (Signed)
-----   Message from Karen Kitchens, NP sent at 06/20/2018  1:33 PM EDT ----- Regarding: Please call..... Wanted to check and see how this gentleman was doing on his antibiotics and steroids.   1. Has the redness improved? 2. Has the swelling resolved? 3. Able to move fingers better at this point? 4. Any other signs of infection (pain, drainage, lymphangitic spread, fever)?  If "no" to any of # 1-3, would be be amenable to coming back in for a wound check, or allowing me to come by his home to look at it? If "yes" to # 4, he needs to come back to clinic for labs and reassessment.   I called him yesterday, but there was no answer. LMOM asking him to call me back, but I never heard anything unless he called today. Please check messages. If nothing, please call him to determine the above and document specifics. Make sure he is not in pain. Hands are a big deal, so I want to stay ahead of things.   Thank you!!! BG

## 2018-06-20 NOTE — Telephone Encounter (Signed)
Left message for patient to return call regarding provider information and questions.

## 2018-06-20 NOTE — Telephone Encounter (Signed)
Left message again for patient to return call

## 2018-06-21 NOTE — Telephone Encounter (Signed)
Contacted patient today to f/u with visit on 05/31. Patient stated both the redness and the swelling have improved significantly. He states he is able to move his fingers much better at this point. He denies pain, drainage, increased swelling and fever. He states he is feeling much better. Advised patient to contact [place] with any further questions or concerns.

## 2018-10-05 DIAGNOSIS — Z23 Encounter for immunization: Secondary | ICD-10-CM | POA: Diagnosis not present

## 2018-11-26 DIAGNOSIS — H04222 Epiphora due to insufficient drainage, left lacrimal gland: Secondary | ICD-10-CM | POA: Diagnosis not present

## 2019-02-21 DIAGNOSIS — H04543 Stenosis of bilateral lacrimal canaliculi: Secondary | ICD-10-CM | POA: Diagnosis not present

## 2019-03-29 ENCOUNTER — Ambulatory Visit
Admission: EM | Admit: 2019-03-29 | Discharge: 2019-03-29 | Disposition: A | Discharge status: Home / Self Care | Payer: Medicare HMO | Attending: Family Medicine | Admitting: Family Medicine

## 2019-03-29 ENCOUNTER — Other Ambulatory Visit: Payer: Self-pay

## 2019-03-29 DIAGNOSIS — X32XXXA Exposure to sunlight, initial encounter: Secondary | ICD-10-CM | POA: Diagnosis not present

## 2019-03-29 DIAGNOSIS — L570 Actinic keratosis: Secondary | ICD-10-CM | POA: Diagnosis not present

## 2019-03-29 DIAGNOSIS — S86811A Strain of other muscle(s) and tendon(s) at lower leg level, right leg, initial encounter: Secondary | ICD-10-CM

## 2019-03-29 DIAGNOSIS — X500XXA Overexertion from strenuous movement or load, initial encounter: Secondary | ICD-10-CM

## 2019-03-29 DIAGNOSIS — L821 Other seborrheic keratosis: Secondary | ICD-10-CM | POA: Diagnosis not present

## 2019-03-29 DIAGNOSIS — D225 Melanocytic nevi of trunk: Secondary | ICD-10-CM | POA: Diagnosis not present

## 2019-03-29 DIAGNOSIS — D2261 Melanocytic nevi of right upper limb, including shoulder: Secondary | ICD-10-CM | POA: Diagnosis not present

## 2019-03-29 DIAGNOSIS — D2262 Melanocytic nevi of left upper limb, including shoulder: Secondary | ICD-10-CM | POA: Diagnosis not present

## 2019-03-29 NOTE — ED Triage Notes (Signed)
Patient states that he was up on a ladder 1 week ago and felt a pop in his right calf and has continued to have pain.

## 2019-03-29 NOTE — Discharge Instructions (Signed)
Rest, ice/heat, over the counter ibuprofen (advil, motrin)

## 2019-03-30 NOTE — ED Provider Notes (Signed)
[facility]    CSN: [id number] Arrival date & time: 03/29/19  1249      History   Chief Complaint Chief Complaint  Patient presents with  . Leg Pain    right    HPI George Davis is a 74 y.o. male.   74 yo male with a c/o right calf pain and swelling after injuring it while on a ladder 1 week ago. States he felt unsteady on used force on his legs to hold on and felt a sudden "pop" in his calf with subsequent pain to the area. Has noted some swelling and bruising further down in the back of the foot but no tenderness to that area.    Leg Pain   Past Medical History:  Diagnosis Date  . Arthritis    fingers  . BPH (benign prostatic hyperplasia)   . Diverticulosis   . Environmental and seasonal allergies   . Hyperlipidemia   . Pneumonia    HISTORY    There are no problems to display for this patient.   Past Surgical History:  Procedure Laterality Date  . BACK SURGERY    . CARPAL TUNNEL RELEASE Left 03/08/2017   Procedure: CARPAL TUNNEL RELEASE;  Surgeon: Leanor Kail, MD;  Location: [facility] ORS;  Service: Orthopedics;  Laterality: Left;  . CATARACT EXTRACTION W/PHACO Left 07/07/2015   Procedure: CATARACT EXTRACTION PHACO AND INTRAOCULAR LENS PLACEMENT (I[facility]);  Surgeon: Birder Robson, MD;  Location: [facility] ORS;  Service: Ophthalmology;  Laterality: Left;  [place] 00:33.8 AP% 20.7 CDE 7.01 fluid pack lot [id number]  . CATARACT EXTRACTION W/PHACO Right 08/11/2015   Procedure: CATARACT EXTRACTION PHACO AND INTRAOCULAR LENS PLACEMENT ([facility]);  Surgeon: Birder Robson, MD;  Location: [facility] ORS;  Service: Ophthalmology;  Laterality: Right;  [place] 49.0 AP% 17.8 CDE 8.73 Fluid Pack lot # [id number]  . COLONOSCOPY W/ POLYPECTOMY  01/03/2003   12/04/2008, 01/01/2014  . COLONOSCOPY WITH PROPOFOL N/A 09/01/2017   Procedure: COLONOSCOPY WITH PROPOFOL;  Surgeon: Manya Silvas, MD;  Location: [facility] ENDOSCOPY;  Service: Endoscopy;  Laterality: N/A;  . HERNIA REPAIR Right 1975     inguinal, left in 1980  . LAMINOTOMY / EXCISION DISK POSTERIOR CERVICAL SPINE  2011   metal in neck  . RCR    . SHOULDER SURGERY Right 1990   left done 1985  . SIGMOIDOSCOPY    . TONSILLECTOMY         Home Medications    Prior to Admission medications   Medication Sig Start Date End Date Taking? Authorizing Provider  aspirin 81 MG tablet Take 81 mg by mouth daily.   Yes [provider]  fluticasone (FLONASE) 50 MCG/ACT nasal spray Place 1 spray into both nostrils 2 (two) times daily. Patient taking differently: Place 2 sprays into both nostrils 2 (two) times daily.  12/29/14 03/29/19 Yes Betancourt, Aura Fey, NP  tamsulosin (FLOMAX) 0.4 MG CAPS capsule Take 0.4 mg by mouth daily.   Yes [provider]  clindamycin (CLEOCIN) 150 MG capsule Take 1 capsule (150 mg total) by mouth every 6 (six) hours. 06/17/18   Karen Kitchens, NP  predniSONE (STERAPRED UNI-PAK 21 TAB) 10 MG (21) TBPK tablet Take by mouth daily. 60 mg x 1 day, 50 mg x 1 day, 40 mg x 1 day, 30 mg x 1 day, 20 mg x 1 day, 10 mg x 1 day 06/17/18   Karen Kitchens, NP    Family History Family History  Problem Relation Age of Onset  .  Stomach cancer Mother   . Prostate cancer Father   . Diabetes Father   . Bladder Cancer Neg Hx   . Kidney cancer Neg Hx     Social History Social History   Tobacco Use  . Smoking status: Former Smoker    Packs/day: 1.00    Years: 28.00    Pack years: 28.00    Types: Cigarettes    Quit date: 09/18/1994    Years since quitting: 24.5  . Smokeless tobacco: Never Used  Substance Use Topics  . Alcohol use: Yes    Alcohol/week: 2.0 standard drinks    Types: 2 Glasses of wine per week    Comment: occasionally  . Drug use: Never     Allergies   Patient has no known allergies.   Review of Systems Review of Systems   Physical Exam Triage Vital Signs ED Triage Vitals  Enc Vitals Group     BP 03/29/19 1322 125/73     Pulse Rate 03/29/19 1322 71     Resp 03/29/19  1322 16     Temp 03/29/19 1322 98.1 F (36.7 C)     Temp Source 03/29/19 1322 Oral     SpO2 03/29/19 1322 97 %     Weight 03/29/19 1322 190 lb (86.2 kg)     Height 03/29/19 1322 5\' 8"  (1.727 m)     Head Circumference --      Peak Flow --      Pain Score 03/29/19 1321 5     Pain Loc --      Pain Edu? --      Excl. in [place]? --    No data found.  Updated Vital Signs BP 125/73 (BP Location: Right Arm)   Pulse 71   Temp 98.1 F (36.7 C) (Oral)   Resp 16   Ht 5\' 8"  (1.727 m)   Wt 86.2 kg   SpO2 97%   BMI 28.89 kg/m   Visual Acuity Right Eye Distance:   Left Eye Distance:   Bilateral Distance:    Right Eye Near:   Left Eye Near:    Bilateral Near:     Physical Exam Vitals and nursing note reviewed.  Constitutional:      General: He is not in acute distress.    Appearance: He is not toxic-appearing or diaphoretic.  Musculoskeletal:     Right lower leg: Tenderness present. No lacerations or bony tenderness. Edema present.     Comments: Right lower calf tenderness to palpation; mild edema noted; ecchymosis and mild edema noted to posterior aspect of foot, however no tenderness to this area; right lower extremity neurovascularly intact  Neurological:     Mental Status: He is alert.      UC Treatments / Results  Labs (all labs ordered are listed, but only abnormal results are displayed) Labs Reviewed - No data to display  EKG   Radiology No results found.  Procedures Procedures (including critical care time)  Medications Ordered in UC Medications - No data to display  Initial Impression / Assessment and Plan / UC Course  I have reviewed the triage vital signs and the nursing notes.  Pertinent labs & imaging results that were available during my care of the patient were reviewed by me and considered in my medical decision making (see chart for details).      Final Clinical Impressions(s) / UC Diagnoses   Final diagnoses:  Strain of calf muscle, right,  initial encounter  Discharge Instructions     Rest, ice/heat, over the counter ibuprofen (advil, motrin)    ED Prescriptions    None      1. diagnosis reviewed with patient 2. Recommend supportive treatment as above 3. Follow-up prn if symptoms worsen or don't improve   PDMP not reviewed this encounter.   Norval Gable, MD 03/30/19 [phone]

## 2019-04-26 DIAGNOSIS — Z125 Encounter for screening for malignant neoplasm of prostate: Secondary | ICD-10-CM | POA: Diagnosis not present

## 2019-04-26 DIAGNOSIS — N401 Enlarged prostate with lower urinary tract symptoms: Secondary | ICD-10-CM | POA: Diagnosis not present

## 2019-04-26 DIAGNOSIS — Z0000 Encounter for general adult medical examination without abnormal findings: Secondary | ICD-10-CM | POA: Diagnosis not present

## 2019-04-26 DIAGNOSIS — R3916 Straining to void: Secondary | ICD-10-CM | POA: Diagnosis not present

## 2019-04-26 DIAGNOSIS — F320 Major depressive disorder, single episode, mild: Secondary | ICD-10-CM | POA: Diagnosis not present

## 2019-04-26 DIAGNOSIS — J309 Allergic rhinitis, unspecified: Secondary | ICD-10-CM | POA: Diagnosis not present

## 2019-04-26 DIAGNOSIS — Z87891 Personal history of nicotine dependence: Secondary | ICD-10-CM | POA: Diagnosis not present

## 2019-05-09 DIAGNOSIS — R69 Illness, unspecified: Secondary | ICD-10-CM | POA: Diagnosis not present

## 2019-05-19 DIAGNOSIS — H02132 Senile ectropion of right lower eyelid: Secondary | ICD-10-CM | POA: Diagnosis not present

## 2019-05-19 DIAGNOSIS — Z20822 Contact with and (suspected) exposure to covid-19: Secondary | ICD-10-CM | POA: Diagnosis not present

## 2019-05-19 DIAGNOSIS — H02135 Senile ectropion of left lower eyelid: Secondary | ICD-10-CM | POA: Diagnosis not present

## 2019-05-22 DIAGNOSIS — E785 Hyperlipidemia, unspecified: Secondary | ICD-10-CM | POA: Diagnosis not present

## 2019-05-22 DIAGNOSIS — H04563 Stenosis of bilateral lacrimal punctum: Secondary | ICD-10-CM | POA: Diagnosis not present

## 2019-05-22 DIAGNOSIS — R3916 Straining to void: Secondary | ICD-10-CM | POA: Diagnosis not present

## 2019-05-22 DIAGNOSIS — M1990 Unspecified osteoarthritis, unspecified site: Secondary | ICD-10-CM | POA: Diagnosis not present

## 2019-05-22 DIAGNOSIS — N401 Enlarged prostate with lower urinary tract symptoms: Secondary | ICD-10-CM | POA: Diagnosis not present

## 2019-05-22 DIAGNOSIS — H02115 Cicatricial ectropion of left lower eyelid: Secondary | ICD-10-CM | POA: Diagnosis not present

## 2019-05-22 DIAGNOSIS — H04542 Stenosis of left lacrimal canaliculi: Secondary | ICD-10-CM | POA: Diagnosis not present

## 2019-05-22 DIAGNOSIS — H04551 Acquired stenosis of right nasolacrimal duct: Secondary | ICD-10-CM | POA: Diagnosis not present

## 2019-05-22 DIAGNOSIS — H02135 Senile ectropion of left lower eyelid: Secondary | ICD-10-CM | POA: Diagnosis not present

## 2019-05-22 DIAGNOSIS — H04523 Eversion of bilateral lacrimal punctum: Secondary | ICD-10-CM | POA: Diagnosis not present

## 2019-05-22 DIAGNOSIS — H04553 Acquired stenosis of bilateral nasolacrimal duct: Secondary | ICD-10-CM | POA: Diagnosis not present

## 2019-05-22 DIAGNOSIS — H02132 Senile ectropion of right lower eyelid: Secondary | ICD-10-CM | POA: Diagnosis not present

## 2019-05-29 ENCOUNTER — Ambulatory Visit: Payer: Medicare HMO | Admitting: Urology

## 2019-09-30 DIAGNOSIS — L820 Inflamed seborrheic keratosis: Secondary | ICD-10-CM | POA: Diagnosis not present

## 2019-09-30 DIAGNOSIS — L298 Other pruritus: Secondary | ICD-10-CM | POA: Diagnosis not present

## 2019-09-30 DIAGNOSIS — L538 Other specified erythematous conditions: Secondary | ICD-10-CM | POA: Diagnosis not present

## 2019-09-30 DIAGNOSIS — L309 Dermatitis, unspecified: Secondary | ICD-10-CM | POA: Diagnosis not present

## 2019-09-30 DIAGNOSIS — B354 Tinea corporis: Secondary | ICD-10-CM | POA: Diagnosis not present

## 2019-10-16 DIAGNOSIS — H04542 Stenosis of left lacrimal canaliculi: Secondary | ICD-10-CM | POA: Diagnosis not present

## 2019-10-23 DIAGNOSIS — H04552 Acquired stenosis of left nasolacrimal duct: Secondary | ICD-10-CM | POA: Diagnosis not present

## 2019-11-04 DIAGNOSIS — Z23 Encounter for immunization: Secondary | ICD-10-CM | POA: Diagnosis not present

## 2020-01-30 DIAGNOSIS — H04542 Stenosis of left lacrimal canaliculi: Secondary | ICD-10-CM | POA: Diagnosis not present

## 2020-03-23 DIAGNOSIS — M5136 Other intervertebral disc degeneration, lumbar region: Secondary | ICD-10-CM | POA: Diagnosis not present

## 2020-03-23 DIAGNOSIS — M545 Low back pain, unspecified: Secondary | ICD-10-CM | POA: Diagnosis not present

## 2020-04-01 DIAGNOSIS — I788 Other diseases of capillaries: Secondary | ICD-10-CM | POA: Diagnosis not present

## 2020-04-01 DIAGNOSIS — X32XXXA Exposure to sunlight, initial encounter: Secondary | ICD-10-CM | POA: Diagnosis not present

## 2020-04-01 DIAGNOSIS — L570 Actinic keratosis: Secondary | ICD-10-CM | POA: Diagnosis not present

## 2020-04-01 DIAGNOSIS — L821 Other seborrheic keratosis: Secondary | ICD-10-CM | POA: Diagnosis not present

## 2020-04-01 DIAGNOSIS — L400 Psoriasis vulgaris: Secondary | ICD-10-CM | POA: Diagnosis not present

## 2020-04-27 DIAGNOSIS — R3916 Straining to void: Secondary | ICD-10-CM | POA: Diagnosis not present

## 2020-04-27 DIAGNOSIS — J309 Allergic rhinitis, unspecified: Secondary | ICD-10-CM | POA: Diagnosis not present

## 2020-04-27 DIAGNOSIS — G5601 Carpal tunnel syndrome, right upper limb: Secondary | ICD-10-CM | POA: Diagnosis not present

## 2020-04-27 DIAGNOSIS — Z0000 Encounter for general adult medical examination without abnormal findings: Secondary | ICD-10-CM | POA: Diagnosis not present

## 2020-04-27 DIAGNOSIS — N401 Enlarged prostate with lower urinary tract symptoms: Secondary | ICD-10-CM | POA: Diagnosis not present

## 2020-04-27 DIAGNOSIS — Z125 Encounter for screening for malignant neoplasm of prostate: Secondary | ICD-10-CM | POA: Diagnosis not present

## 2020-04-27 DIAGNOSIS — F325 Major depressive disorder, single episode, in full remission: Secondary | ICD-10-CM | POA: Diagnosis not present

## 2020-05-06 DIAGNOSIS — Z0100 Encounter for examination of eyes and vision without abnormal findings: Secondary | ICD-10-CM | POA: Diagnosis not present

## 2020-05-06 DIAGNOSIS — Z961 Presence of intraocular lens: Secondary | ICD-10-CM | POA: Diagnosis not present

## 2020-05-06 DIAGNOSIS — H40003 Preglaucoma, unspecified, bilateral: Secondary | ICD-10-CM | POA: Diagnosis not present

## 2020-05-27 DIAGNOSIS — R058 Other specified cough: Secondary | ICD-10-CM | POA: Diagnosis not present

## 2020-05-27 DIAGNOSIS — Z981 Arthrodesis status: Secondary | ICD-10-CM | POA: Diagnosis not present

## 2020-05-27 DIAGNOSIS — U071 COVID-19: Secondary | ICD-10-CM | POA: Diagnosis not present

## 2020-05-27 DIAGNOSIS — R918 Other nonspecific abnormal finding of lung field: Secondary | ICD-10-CM | POA: Diagnosis not present

## 2020-05-27 DIAGNOSIS — M47814 Spondylosis without myelopathy or radiculopathy, thoracic region: Secondary | ICD-10-CM | POA: Diagnosis not present

## 2020-07-10 DIAGNOSIS — R69 Illness, unspecified: Secondary | ICD-10-CM | POA: Diagnosis not present

## 2020-10-30 DIAGNOSIS — Z23 Encounter for immunization: Secondary | ICD-10-CM | POA: Diagnosis not present

## 2020-11-26 DIAGNOSIS — H40003 Preglaucoma, unspecified, bilateral: Secondary | ICD-10-CM | POA: Diagnosis not present

## 2020-11-26 DIAGNOSIS — M3501 Sicca syndrome with keratoconjunctivitis: Secondary | ICD-10-CM | POA: Diagnosis not present

## 2021-01-05 ENCOUNTER — Ambulatory Visit
Admission: EM | Admit: 2021-01-05 | Discharge: 2021-01-05 | Disposition: A | Discharge status: Home / Self Care | Payer: Medicare HMO | Attending: Internal Medicine | Admitting: Internal Medicine

## 2021-01-05 ENCOUNTER — Encounter: Payer: Self-pay | Admitting: Emergency Medicine

## 2021-01-05 ENCOUNTER — Other Ambulatory Visit: Payer: Self-pay

## 2021-01-05 DIAGNOSIS — J209 Acute bronchitis, unspecified: Secondary | ICD-10-CM

## 2021-01-05 MED ORDER — BENZONATATE 200 MG PO CAPS: 200.0000 mg | ORAL_CAPSULE | Freq: Three times a day (TID) | ORAL | 0 refills | Status: DC | PRN

## 2021-01-05 MED ORDER — PREDNISONE 50 MG PO TABS: 50.0000 mg | ORAL_TABLET | Freq: Every day | ORAL | 0 refills | Status: DC

## 2021-01-05 MED ORDER — ALBUTEROL SULFATE HFA 108 (90 BASE) MCG/ACT IN AERS: 1.0000 | INHALATION_SPRAY | RESPIRATORY_TRACT | 0 refills | Status: DC | PRN

## 2021-01-05 NOTE — ED Provider Notes (Signed)
[facility]    CSN: [id number] Arrival date & time: 01/05/21  1001      History   Chief Complaint Chief Complaint  Patient presents with   Nasal Congestion   Cough   Shortness of Breath    HPI George Davis is a 75 y.o. male.  He presents today with a least 2 weeks history of cough, started with a febrile respiratory illness.  Fever has resolved, but cough has persisted.  Productive of clear phlegm.  Cough is worse at night when he lies down, and accompanied by some wheezing.  Some runny/congested nose with postnasal drainage.  Not vomiting, no diarrhea, no fever currently.  Not eating well.  Still feels rough.  HPI  Past Medical History:  Diagnosis Date   Arthritis    fingers   BPH (benign prostatic hyperplasia)    Diverticulosis    Environmental and seasonal allergies    Hyperlipidemia    Pneumonia    HISTORY    There are no problems to display for this patient.   Past Surgical History:  Procedure Laterality Date   BACK SURGERY     CARPAL TUNNEL RELEASE Left 03/08/2017   Procedure: CARPAL TUNNEL RELEASE;  Surgeon: Leanor Kail, MD;  Location: [facility] ORS;  Service: Orthopedics;  Laterality: Left;   CATARACT EXTRACTION W/PHACO Left 07/07/2015   Procedure: CATARACT EXTRACTION PHACO AND INTRAOCULAR LENS PLACEMENT (IOC);  Surgeon: Birder Robson, MD;  Location: [facility] ORS;  Service: Ophthalmology;  Laterality: Left;  [place] 00:33.8 AP% 20.7 CDE 7.01 fluid pack lot #[id number]   CATARACT EXTRACTION W/PHACO Right 08/11/2015   Procedure: CATARACT EXTRACTION PHACO AND INTRAOCULAR LENS PLACEMENT ([place]);  Surgeon: Birder Robson, MD;  Location: [facility] ORS;  Service: Ophthalmology;  Laterality: Right;  [place] 49.0 AP% 17.8 CDE 8.73 Fluid Pack lot # [id number] H   COLONOSCOPY W/ POLYPECTOMY  01/03/2003   12/04/2008, 01/01/2014   COLONOSCOPY WITH PROPOFOL N/A 09/01/2017   Procedure: COLONOSCOPY WITH PROPOFOL;  Surgeon: Manya Silvas, MD;  Location: [facility] ENDOSCOPY;   Service: Endoscopy;  Laterality: N/A;   HERNIA REPAIR Right 1975   inguinal, left in 1980   LAMINOTOMY / EXCISION DISK POSTERIOR CERVICAL SPINE  2011   metal in neck   RCR     SHOULDER SURGERY Right 1990   left done [place] Medications    Prior to Admission medications   Medication Sig Start Date End Date Taking? Authorizing Provider  albuterol (VENTOLIN HFA) 108 (90 Base) MCG/ACT inhaler Inhale 1-2 puffs into the lungs every 4 (four) hours as needed for wheezing or shortness of breath. 01/05/21  Yes Wynona Luna, MD  benzonatate (TESSALON) 200 MG capsule Take 1 capsule (200 mg total) by mouth 3 (three) times daily as needed for cough. 01/05/21  Yes Wynona Luna, MD  fluticasone [facility]) 50 MCG/ACT nasal spray Place 2 sprays into both nostrils daily. 12/04/20  Yes [provider]  predniSONE (DELTASONE) 50 MG tablet Take 1 tablet (50 mg total) by mouth daily. 01/05/21  Yes Wynona Luna, MD  tamsulosin (FLOMAX) 0.4 MG CAPS capsule Take 0.4 mg by mouth daily.   Yes [provider]  aspirin 81 MG tablet Take 81 mg by mouth daily.    [provider]  fluticasone (FLONASE) 50 MCG/ACT nasal spray Place 1 spray into both nostrils 2 (two) times daily. Patient taking differently: Place 2 sprays into both  nostrils 2 (two) times daily.  12/29/14 03/29/19  Betancourt, Aura Fey, NP    Family History Family History  Problem Relation Age of Onset   Stomach cancer Mother    Prostate cancer Father    Diabetes Father    Bladder Cancer Neg Hx    Kidney cancer Neg Hx     Social History Social History   Tobacco Use   Smoking status: Former    Packs/day: 1.00    Years: 28.00    Pack years: 28.00    Types: Cigarettes    Quit date: 09/18/1994    Years since quitting: 26.3   Smokeless tobacco: Never  Vaping Use   Vaping Use: Never used  Substance Use Topics   Alcohol use: Yes    Alcohol/week: 2.0  standard drinks    Types: 2 Glasses of wine per week    Comment: occasionally   Drug use: Never     Allergies   Patient has no known allergies.   Review of Systems Review of Systems see HPI   Physical Exam Triage Vital Signs ED Triage Vitals  Enc Vitals Group     BP 01/05/21 1058 133/80     Pulse Rate 01/05/21 1058 88     Resp 01/05/21 1058 20     Temp 01/05/21 1058 98.6 F (37 C)     Temp Source 01/05/21 1058 Oral     SpO2 01/05/21 1058 99 %     Weight --      Height --      Pain Score 01/05/21 1055 0     Pain Loc --    Updated Vital Signs BP 133/80 (BP Location: Right Arm)    Pulse 88    Temp 98.6 F (37 C) (Oral)    Resp 20    SpO2 99%   Physical Exam Constitutional:      General: He is not in acute distress.    Appearance: He is not ill-appearing or toxic-appearing.     Comments: Some coughing during evaluation  HENT:     Head: Atraumatic.     Comments: Bilateral TMs are unremarkable Moderate nasal congestion bilaterally, with mucousy discharge evident Throat is slightly injected with postnasal drainage evident Eyes:     Conjunctiva/sclera:     Right eye: Right conjunctiva is not injected. No exudate.    Left eye: Left conjunctiva is not injected. No exudate.    Comments: Conjugate gaze observed  Cardiovascular:     Rate and Rhythm: Normal rate and regular rhythm.  Pulmonary:     Effort: Pulmonary effort is normal. No respiratory distress.     Breath sounds: No wheezing or rhonchi.  Abdominal:     Comments: No distention  Musculoskeletal:     Comments: Able to walk into urgent care independently  Skin:    General: Skin is warm and dry.     Coloration: Skin is not cyanotic.     Comments: Pink  Neurological:     Mental Status: He is alert.     Comments: Face is symmetric, speech is clear, coherent     UC Treatments / Results  Labs (all labs ordered are listed, but only abnormal results are displayed) Labs Reviewed - No data to display No labs  done at urgent care today  EKG N/A  Radiology No results found. No imaging done at urgent care today  Procedures Procedures (including critical care time) N/A  Medications Ordered in UC Medications - No data to  display No meds given at urgent care today   Final Clinical Impressions(s) / UC Diagnoses   Final diagnoses:  Acute bronchitis, unspecified organism     Discharge Instructions      Symptoms and exam today seem most consistent with bronchitis after a viral respiratory infection a couple weeks ago.  Prescriptions for prednisone, albuterol inhaler, and benzonatate cough perles were sent to the pharmacy.  Push fluids and rest.  Take tylenol or advil otc as needed for fever, discomfort.  Eat fruits and vegetables to help your immune system do its best work.  Anticipate gradual improvement over the next several days.  Recheck for new fever >100.5, increasing phlegm production/nasal discharge, or if not starting to improve in a few days.      ED Prescriptions     Medication Sig Dispense Auth. Provider   predniSONE (DELTASONE) 50 MG tablet Take 1 tablet (50 mg total) by mouth daily. 3 tablet Wynona Luna, MD   albuterol (VENTOLIN HFA) 108 (90 Base) MCG/ACT inhaler Inhale 1-2 puffs into the lungs every 4 (four) hours as needed for wheezing or shortness of breath. 1 each Wynona Luna, MD   benzonatate (TESSALON) 200 MG capsule Take 1 capsule (200 mg total) by mouth 3 (three) times daily as needed for cough. 30 capsule Wynona Luna, MD      PDMP not reviewed this encounter.   Wynona Luna, MD 01/06/21 [phone]

## 2021-01-05 NOTE — ED Triage Notes (Signed)
Pt presents today with c/o sinus pressure and dry hacking cough x 2 weeks with SOB. He reports fever initially.

## 2021-01-05 NOTE — Discharge Instructions (Signed)
Symptoms and exam today seem most consistent with bronchitis after a viral respiratory infection a couple weeks ago.  Prescriptions for prednisone, albuterol inhaler, and benzonatate cough perles were sent to the pharmacy.  Push fluids and rest.  Take tylenol or advil otc as needed for fever, discomfort.  Eat fruits and vegetables to help your immune system do its best work.  Anticipate gradual improvement over the next several days.  Recheck for new fever >100.5, increasing phlegm production/nasal discharge, or if not starting to improve in a few days.

## 2021-01-13 ENCOUNTER — Other Ambulatory Visit: Payer: Self-pay

## 2021-01-13 ENCOUNTER — Ambulatory Visit: Payer: Self-pay | Admitting: *Deleted

## 2021-01-13 ENCOUNTER — Ambulatory Visit
Admission: EM | Admit: 2021-01-13 | Discharge: 2021-01-13 | Disposition: A | Discharge status: Home / Self Care | Payer: Medicare HMO | Attending: Physician Assistant | Admitting: Physician Assistant

## 2021-01-13 ENCOUNTER — Ambulatory Visit (INDEPENDENT_AMBULATORY_CARE_PROVIDER_SITE_OTHER): Payer: Medicare HMO

## 2021-01-13 DIAGNOSIS — R0981 Nasal congestion: Secondary | ICD-10-CM

## 2021-01-13 DIAGNOSIS — J019 Acute sinusitis, unspecified: Secondary | ICD-10-CM

## 2021-01-13 DIAGNOSIS — R051 Acute cough: Secondary | ICD-10-CM

## 2021-01-13 DIAGNOSIS — R059 Cough, unspecified: Secondary | ICD-10-CM | POA: Diagnosis not present

## 2021-01-13 MED ORDER — AMOXICILLIN-POT CLAVULANATE 875-125 MG PO TABS: 1.0000 | ORAL_TABLET | Freq: Two times a day (BID) | ORAL | 0 refills | Status: AC

## 2021-01-13 MED ORDER — PREDNISONE 20 MG PO TABS: 40.0000 mg | ORAL_TABLET | Freq: Every day | ORAL | 0 refills | Status: AC

## 2021-01-13 MED ORDER — BENZONATATE 200 MG PO CAPS: 200.0000 mg | ORAL_CAPSULE | Freq: Three times a day (TID) | ORAL | 0 refills | Status: DC | PRN

## 2021-01-13 NOTE — Telephone Encounter (Signed)
° °  Chief Complaint: c/o nasal  congestion and difficulty breathing through nose , requesting z pak or medication  Symptoms: coughing up clear phlegm, difficulty breathing through nose inside  Frequency: since 12/20/20 Pertinent Negatives: Patient denies chest pain, difficulty breathing constant, no  Disposition: [] ED /[x] Urgent Care (no appt availability in office) / [] Appointment(In office/virtual)/ [x]  [facility]/ [] Home Care/ [] Refused Recommended Disposition  Additional Notes:  Requesting to change providers. Reviewed with patient unable to get medication from [facility] until after initial new patient visit. Recommended E visit at [facility] or go to UC . Patient reports he will go to UC .          Reason for Disposition  [1] MILD longstanding difficulty breathing AND [2]  SAME as normal  Answer Assessment - Initial Assessment Questions 1. RESPIRATORY STATUS: "Describe your breathing?" (e.g., wheezing, shortness of breath, unable to speak, severe coughing)      Nasal congestion and difficulty breathing through nose 2. ONSET: "When did this breathing problem begin?"      12/20/20 3. PATTERN "Does the difficult breathing come and go, or has it been constant since it started?"      Comes and goes . When outside in cool air can breath ,inside warm air nasal congestion again  4. SEVERITY: "How bad is your breathing?" (e.g., mild, moderate, severe)    - MILD: No SOB at rest, mild SOB with walking, speaks normally in sentences, can lie down, no retractions, pulse < 100.    - MODERATE: SOB at rest, SOB with minimal exertion and prefers to sit, cannot lie down flat, speaks in phrases, mild retractions, audible wheezing, pulse 100-120.    - SEVERE: Very SOB at rest, speaks in single words, struggling to breathe, sitting hunched forward, retractions, pulse > 120      na 5. RECURRENT SYMPTOM: "Have you had difficulty breathing before?" If Yes, ask: "When was the last time?" and  "What happened that time?"      Yes  6. CARDIAC HISTORY: "Do you have any history of heart disease?" (e.g., heart attack, angina, bypass surgery, angioplasty)      na 7. LUNG HISTORY: "Do you have any history of lung disease?"  (e.g., pulmonary embolus, asthma, emphysema)     na 8. CAUSE: "What do you think is causing the breathing problem?"      Bronchitis since 12/20/20 9. OTHER SYMPTOMS: "Do you have any other symptoms? (e.g., dizziness, runny nose, cough, chest pain, fever)     Coughing up clear phlegm  10. O2 SATURATION MONITOR:  "Do you use an oxygen saturation monitor (pulse oximeter) at home?" If Yes, "What is your reading (oxygen level) today?" "What is your usual oxygen saturation reading?" (e.g., 95%)       na 11. PREGNANCY: "Is there any chance you are pregnant?" "When was your last menstrual period?"       na 12. TRAVEL: "Have you traveled out of the country in the last month?" (e.g., travel history, exposures)       na  Protocols used: Breathing Difficulty-A-AH

## 2021-01-13 NOTE — ED Provider Notes (Signed)
[facility]    CSN: [id number] Arrival date & time: 01/13/21  1422      History   Chief Complaint Chief Complaint  Patient presents with   Follow-up    HPI George Davis is a 75 y.o. male presenting for nearly 3-week history of cough and congestion.  Patient reports that he was seen here about a week ago and treated for bronchitis with a cough medicine and a couple days of prednisone.  Reports the cough got better but now he feels a lot more congested nasally and has a lot of sinus pain and pressure.  Reports nasal drainage is yellowish-green and occasionally blood-tinged.  Denies fever, chest pain, breathing difficulty.  Concerned since he is still having symptoms after 3 weeks.  No other complaints.  HPI  Past Medical History:  Diagnosis Date   Arthritis    fingers   BPH (benign prostatic hyperplasia)    Diverticulosis    Environmental and seasonal allergies    Hyperlipidemia    Pneumonia    HISTORY    There are no problems to display for this patient.   Past Surgical History:  Procedure Laterality Date   BACK SURGERY     CARPAL TUNNEL RELEASE Left 03/08/2017   Procedure: CARPAL TUNNEL RELEASE;  Surgeon: Leanor Kail, MD;  Location: [facility] ORS;  Service: Orthopedics;  Laterality: Left;   CATARACT EXTRACTION W/PHACO Left 07/07/2015   Procedure: CATARACT EXTRACTION PHACO AND INTRAOCULAR LENS PLACEMENT (IOC);  Surgeon: Birder Robson, MD;  Location: [facility] ORS;  Service: Ophthalmology;  Laterality: Left;  [place] 00:33.8 AP% 20.7 CDE 7.01 fluid pack lot #[id number] H   CATARACT EXTRACTION W/PHACO Right 08/11/2015   Procedure: CATARACT EXTRACTION PHACO AND INTRAOCULAR LENS PLACEMENT ([place]);  Surgeon: Birder Robson, MD;  Location: [facility] ORS;  Service: Ophthalmology;  Laterality: Right;  [place] 49.0 AP% 17.8 CDE 8.73 Fluid Pack lot # [id number] H   COLONOSCOPY W/ POLYPECTOMY  01/03/2003   12/04/2008, 01/01/2014   COLONOSCOPY WITH PROPOFOL N/A 09/01/2017   Procedure:  COLONOSCOPY WITH PROPOFOL;  Surgeon: Manya Silvas, MD;  Location: [facility] ENDOSCOPY;  Service: Endoscopy;  Laterality: N/A;   HERNIA REPAIR Right 1975   inguinal, left in 1980   LAMINOTOMY / EXCISION DISK POSTERIOR CERVICAL SPINE  2011   metal in neck   RCR     SHOULDER SURGERY Right 1990   left done [place] Medications    Prior to Admission medications   Medication Sig Start Date End Date Taking? Authorizing Provider  albuterol (VENTOLIN HFA) 108 (90 Base) MCG/ACT inhaler Inhale 1-2 puffs into the lungs every 4 (four) hours as needed for wheezing or shortness of breath. 01/05/21  Yes Wynona Luna, MD  amoxicillin-clavulanate (AUGMENTIN) 875-125 MG tablet Take 1 tablet by mouth every 12 (twelve) hours for 7 days. 01/13/21 01/20/21 Yes Danton Clap, PA-C  aspirin 81 MG tablet Take 81 mg by mouth daily.   Yes [provider]  predniSONE (DELTASONE) 20 MG tablet Take 2 tablets (40 mg total) by mouth daily for 5 days. 01/13/21 01/18/21 Yes Danton Clap, PA-C  tamsulosin (FLOMAX) 0.4 MG CAPS capsule Take 0.4 mg by mouth daily.   Yes [provider]  benzonatate (TESSALON) 200 MG capsule Take 1 capsule (200 mg total) by mouth 3 (three) times daily as needed for cough. 01/13/21   Laurene Footman B, PA-C  fluticasone (FLONASE) 50 MCG/ACT  nasal spray Place 1 spray into both nostrils 2 (two) times daily. Patient taking differently: Place 2 sprays into both nostrils 2 (two) times daily.  12/29/14 03/29/19  Betancourt, Aura Fey, NP  fluticasone (FLONASE) 50 MCG/ACT nasal spray Place 2 sprays into both nostrils daily. 12/04/20   [provider]    Family History Family History  Problem Relation Age of Onset   Stomach cancer Mother    Prostate cancer Father    Diabetes Father    Bladder Cancer Neg Hx    Kidney cancer Neg Hx     Social History Social History   Tobacco Use   Smoking status: Former    Packs/day:  1.00    Years: 28.00    Pack years: 28.00    Types: Cigarettes    Quit date: 09/18/1994    Years since quitting: 26.3   Smokeless tobacco: Never  Vaping Use   Vaping Use: Never used  Substance Use Topics   Alcohol use: Yes    Alcohol/week: 2.0 standard drinks    Types: 2 Glasses of wine per week    Comment: occasionally   Drug use: Never     Allergies   Levonorgestrel-ethinyl estrad   Review of Systems Review of Systems  Constitutional:  Positive for fatigue. Negative for fever.  HENT:  Positive for congestion, rhinorrhea, sinus pressure and sinus pain. Negative for sore throat.   Respiratory:  Positive for cough. Negative for shortness of breath and wheezing.   Cardiovascular:  Negative for chest pain.  Gastrointestinal:  Negative for abdominal pain, diarrhea, nausea and vomiting.  Musculoskeletal:  Negative for myalgias.  Neurological:  Negative for weakness, light-headedness and headaches.  Hematological:  Negative for adenopathy.  Psychiatric/Behavioral:  Positive for sleep disturbance.     Physical Exam Triage Vital Signs ED Triage Vitals  Enc Vitals Group     BP 01/13/21 1554 123/77     Pulse Rate 01/13/21 1554 81     Resp 01/13/21 1554 18     Temp 01/13/21 1554 97.8 F (36.6 C)     Temp Source 01/13/21 1554 Oral     SpO2 01/13/21 1554 99 %     Weight 01/13/21 1556 190 lb 0.6 oz (86.2 kg)     Height --      Head Circumference --      Peak Flow --      Pain Score 01/13/21 1554 0     Pain Loc --      Pain Edu? --      Excl. in [place]? --    No data found.  Updated Vital Signs BP 123/77 (BP Location: Left Arm)    Pulse 81    Temp 97.8 F (36.6 C) (Oral)    Resp 18    Wt 190 lb 0.6 oz (86.2 kg)    SpO2 99%    BMI 28.89 kg/m      Physical Exam Vitals and nursing note reviewed.  Constitutional:      General: He is not in acute distress.    Appearance: Normal appearance. He is well-developed. He is ill-appearing.  HENT:     Head: Normocephalic and  atraumatic.     Nose: Congestion and rhinorrhea present.     Right Sinus: Maxillary sinus tenderness present.     Left Sinus: Maxillary sinus tenderness present.     Mouth/Throat:     Mouth: Mucous membranes are moist.     Pharynx: Oropharynx is clear. No posterior oropharyngeal erythema.  Eyes:     General: No scleral icterus.    Conjunctiva/sclera: Conjunctivae normal.  Cardiovascular:     Rate and Rhythm: Normal rate and regular rhythm.  Pulmonary:     Effort: Pulmonary effort is normal. No respiratory distress.     Breath sounds: Normal breath sounds. No wheezing, rhonchi or rales.  Musculoskeletal:     Cervical back: Neck supple.  Skin:    General: Skin is warm and dry.     Capillary Refill: Capillary refill takes less than 2 seconds.  Neurological:     General: No focal deficit present.     Mental Status: He is alert. Mental status is at baseline.     Motor: No weakness.     Coordination: Coordination normal.     Gait: Gait normal.  Psychiatric:        Mood and Affect: Mood normal.        Behavior: Behavior normal.        Thought Content: Thought content normal.     UC Treatments / Results  Labs (all labs ordered are listed, but only abnormal results are displayed) Labs Reviewed - No data to display  EKG   Radiology DG Chest 2 View  Result Date: 01/13/2021 CLINICAL DATA:  Cough for 3 weeks EXAM: CHEST - 2 VIEW COMPARISON:  12/28/2014, CT 03/26/2009 FINDINGS: No focal opacity or pleural effusion. Normal cardiomediastinal silhouette. No pneumothorax. Hardware in the cervical spine. IMPRESSION: No active cardiopulmonary disease. Electronically Signed   By: Donavan Foil M.D.   On: 01/13/2021 16:58    Procedures Procedures (including critical care time)  Medications Ordered in UC Medications - No data to display  Initial Impression / Assessment and Plan / UC Course  I have reviewed the triage vital signs and the nursing notes.  Pertinent labs & imaging  results that were available during my care of the patient were reviewed by me and considered in my medical decision making (see chart for details).  75 year old male presenting for 3-week history of cough and congestion.  Cough is improved in the past week but nasal congestion and sinus pain has worsened.  Vitals all normal and stable.  Patient is ill-appearing but nontoxic.  Exam significant for nasal congestion and yellowish rhinorrhea.  Maxillary sinus tenderness.  Chest clear auscultation.  Chest x-ray obtained to assess for possible pneumonia given duration of symptoms.  X-ray independently viewed by me.  X-ray negative for pneumonia.  Discussed this with patient.  Treating patient for suspected secondary bacterial sinusitis with Augmentin.  Also sent in prednisone and benzonatate.  Encouraged increase rest and fluids.  Reviewed that bronchitis and sinusitis can last for a few weeks sometimes but he should feel better over the next week.  Reviewed return and ER precautions.   Final Clinical Impressions(s) / UC Diagnoses   Final diagnoses:  Acute sinusitis, recurrence not specified, unspecified location  Acute cough  Nasal congestion     Discharge Instructions      -Your x-ray is normal.  No evidence pneumonia.  I believe you have a sinus infection so sent antibiotics to the pharmacy as well as more cough medication and prednisone to help open things up.  Use the inhaler if needed for any shortness of breath. - As we discussed, bronchitis and sinus infection can last for several weeks sometimes.  You should be seen again if you are not feeling better after the next 10 days or if you develop a fever, worsening cough or increased  breathing problem.     ED Prescriptions     Medication Sig Dispense Auth. Provider   benzonatate (TESSALON) 200 MG capsule Take 1 capsule (200 mg total) by mouth 3 (three) times daily as needed for cough. 30 capsule Laurene Footman B, PA-C   predniSONE  (DELTASONE) 20 MG tablet Take 2 tablets (40 mg total) by mouth daily for 5 days. 10 tablet Laurene Footman B, PA-C   amoxicillin-clavulanate (AUGMENTIN) 875-125 MG tablet Take 1 tablet by mouth every 12 (twelve) hours for 7 days. 14 tablet Gretta Cool      PDMP not reviewed this encounter.   Danton Clap, PA-C 01/13/21 1818

## 2021-01-13 NOTE — ED Triage Notes (Signed)
Patient is here for "Congestion'. Here today for "Bronchitis follow up" (no better). Finished medication given at that time "it wasn't enough". No fever at present. No sob. No wheezing.

## 2021-01-13 NOTE — Discharge Instructions (Signed)
-  Your x-ray is normal.  No evidence pneumonia.  I believe you have a sinus infection so sent antibiotics to the pharmacy as well as more cough medication and prednisone to help open things up.  Use the inhaler if needed for any shortness of breath. - As we discussed, bronchitis and sinus infection can last for several weeks sometimes.  You should be seen again if you are not feeling better after the next 10 days or if you develop a fever, worsening cough or increased breathing problem.

## 2021-02-02 ENCOUNTER — Ambulatory Visit: Payer: Medicare HMO | Admitting: Nurse Practitioner

## 2021-02-05 ENCOUNTER — Other Ambulatory Visit (HOSPITAL_COMMUNITY): Payer: Self-pay

## 2021-02-06 ENCOUNTER — Other Ambulatory Visit (HOSPITAL_COMMUNITY): Payer: Self-pay

## 2021-02-06 MED ORDER — ALBUTEROL SULFATE HFA 108 (90 BASE) MCG/ACT IN AERS: 2.0000 | INHALATION_SPRAY | Freq: Four times a day (QID) | RESPIRATORY_TRACT | 1 refills | Status: DC | PRN

## 2021-02-06 MED ORDER — FLUTICASONE PROPIONATE 50 MCG/ACT NA SUSP: 2.0000 | Freq: Every day | NASAL | 4 refills | Status: DC

## 2021-02-06 MED ORDER — TAMSULOSIN HCL 0.4 MG PO CAPS: 0.4000 mg | ORAL_CAPSULE | Freq: Every day | ORAL | 4 refills | Status: DC

## 2021-03-08 ENCOUNTER — Ambulatory Visit
Admission: EM | Admit: 2021-03-08 | Discharge: 2021-03-08 | Disposition: A | Discharge status: Home / Self Care | Payer: PPO | Attending: Emergency Medicine | Admitting: Emergency Medicine

## 2021-03-08 ENCOUNTER — Other Ambulatory Visit: Payer: Self-pay

## 2021-03-08 DIAGNOSIS — J189 Pneumonia, unspecified organism: Secondary | ICD-10-CM | POA: Insufficient documentation

## 2021-03-08 DIAGNOSIS — Z20822 Contact with and (suspected) exposure to covid-19: Secondary | ICD-10-CM | POA: Insufficient documentation

## 2021-03-08 LAB — RESP PANEL BY RT-PCR (FLU A&B, COVID) ARPGX2
Influenza A by PCR: NEGATIVE
Influenza B by PCR: NEGATIVE
SARS Coronavirus 2 by RT PCR: NEGATIVE

## 2021-03-08 MED ORDER — AZITHROMYCIN 250 MG PO TABS: 250.0000 mg | ORAL_TABLET | Freq: Every day | ORAL | 0 refills | Status: DC

## 2021-03-08 MED ORDER — AMOXICILLIN-POT CLAVULANATE 875-125 MG PO TABS: 1.0000 | ORAL_TABLET | Freq: Two times a day (BID) | ORAL | 0 refills | Status: AC

## 2021-03-08 MED ORDER — PREDNISONE 10 MG (21) PO TBPK: ORAL_TABLET | ORAL | 0 refills | Status: DC

## 2021-03-08 NOTE — ED Provider Notes (Signed)
[facility] Provider Note  Patient Contact: 4:45 PM (approximate)   History   Nasal Congestion and Cough   HPI  George Davis is a 76 y.o. male presents to the urgent care with cough and wheezing for 1 week.  Patient states that he has a cough productive for purulent sputum production.  He denies shortness of breath but states he does feel occasional chest tightness.  No chest pain.  No lower extremity edema.  He denies daily smoking, recent travel or recent surgery.  No sick contacts in the home with similar symptoms.      Physical Exam   Triage Vital Signs: ED Triage Vitals  Enc Vitals Group     BP 03/08/21 1607 116/68     Pulse Rate 03/08/21 1607 88     Resp 03/08/21 1607 20     Temp 03/08/21 1607 98.5 F (36.9 C)     Temp Source 03/08/21 1607 Oral     SpO2 03/08/21 1607 96 %     Weight 03/08/21 1610 190 lb (86.2 kg)     Height 03/08/21 1610 5\' 8"  (1.727 m)     Head Circumference --      Peak Flow --      Pain Score 03/08/21 1607 0     Pain Loc --      Pain Edu? --      Excl. in [place]? --     Most recent vital signs: Vitals:   03/08/21 1607  BP: 116/68  Pulse: 88  Resp: 20  Temp: 98.5 F (36.9 C)  SpO2: 96%     General: Alert and in no acute distress. Eyes:  PERRL. EOMI. Head: No acute traumatic findings ENT:      Ears:       Nose: No congestion/rhinnorhea.      Mouth/Throat: Mucous membranes are moist.  Neck: No stridor. No cervical spine tenderness to palpation. Cardiovascular:  Good peripheral perfusion Respiratory: Normal respiratory effort without tachypnea or retractions.  Patient has crackles auscultated in the lung base on the left.  Good air entry to the bases with no decreased or absent breath sounds. Gastrointestinal: Bowel sounds 4 quadrants. Soft and nontender to palpation. No guarding or rigidity. No palpable masses. No distention. No CVA tenderness. Musculoskeletal: Full range of motion to all extremities.   Neurologic:  No gross focal neurologic deficits are appreciated.  Skin:   No rash noted Other:   ED Results / Procedures / Treatments   Labs (all labs ordered are listed, but only abnormal results are displayed) Labs Reviewed  RESP PANEL BY RT-PCR (FLU A&B, COVID) [id number]       PROCEDURES:  Critical Care performed: No  Procedures   MEDICATIONS ORDERED IN ED: Medications - No data to display   IMPRESSION / MDM / [place] / ED COURSE  I reviewed the triage vital signs and the nursing notes.                              Assessment and Plan: Pneumonia:  Differential diagnosis includes, but is not limited to, community-acquired pneumonia versus bronchitis versus COPD  76 year old male presents to the urgent care with cough productive for purulent sputum production for the past 7 days and some generalized malaise at home.  Vital signs are reassuring at triage.  On physical exam, patient was alert, active and nontoxic-appearing and satting at 96% on room  air.  He did have crackles in the lung base on the left.  Given duration of cough, will treat patient empirically for community-acquired pneumonia with Augmentin and azithromycin as well as a tapered steroid.  Patient was cautioned that should his symptoms worsen, he should seek care at local emergency department.  He voiced understanding.   FINAL CLINICAL IMPRESSION(S) / ED DIAGNOSES   Final diagnoses:  Community acquired pneumonia, unspecified laterality     Rx / DC Orders   ED Discharge Orders          Ordered    amoxicillin-clavulanate (AUGMENTIN) 875-125 MG tablet  2 times daily        03/08/21 1638    azithromycin (ZITHROMAX) 250 MG tablet  Daily        03/08/21 1638    predniSONE (STERAPRED UNI-PAK 21 TAB) 10 MG (21) TBPK tablet        03/08/21 1638             Note:  This document was prepared using Dragon voice recognition software and may include unintentional dictation errors.    George Davis [place], [place] 03/08/21 [phone]

## 2021-03-08 NOTE — Discharge Instructions (Signed)
Take prednisone as directed. Take Augmentin and azithromycin as directed.

## 2021-03-08 NOTE — ED Triage Notes (Signed)
Patient here for "Congestion, Cough, Fatigue". Symptoms started about a week ago. Concerned with "Bronchitis". No wheezing. No sob. No fever known. "Chills for about 4-5 days".

## 2021-03-24 ENCOUNTER — Ambulatory Visit: Payer: Self-pay | Admitting: Nurse Practitioner

## 2021-03-26 ENCOUNTER — Ambulatory Visit
Admission: RE | Admit: 2021-03-26 | Discharge: 2021-03-26 | Disposition: A | Discharge status: Home / Self Care | Payer: PPO | Source: Ambulatory Visit | Attending: Nurse Practitioner | Admitting: Nurse Practitioner

## 2021-03-26 ENCOUNTER — Other Ambulatory Visit: Payer: Self-pay

## 2021-03-26 ENCOUNTER — Ambulatory Visit
Admission: RE | Admit: 2021-03-26 | Discharge: 2021-03-26 | Disposition: A | Discharge status: Home / Self Care | Payer: PPO | Attending: Nurse Practitioner | Admitting: Nurse Practitioner

## 2021-03-26 ENCOUNTER — Ambulatory Visit (INDEPENDENT_AMBULATORY_CARE_PROVIDER_SITE_OTHER): Payer: PPO | Admitting: Nurse Practitioner

## 2021-03-26 ENCOUNTER — Encounter: Payer: Self-pay | Admitting: Nurse Practitioner

## 2021-03-26 VITALS — BP 112/72 | HR 96 | Temp 98.6°F | Resp 16 | Ht 67.0 in | Wt 194.3 lb

## 2021-03-26 DIAGNOSIS — Z7689 Persons encountering health services in other specified circumstances: Secondary | ICD-10-CM | POA: Diagnosis not present

## 2021-03-26 DIAGNOSIS — R053 Chronic cough: Secondary | ICD-10-CM | POA: Diagnosis not present

## 2021-03-26 DIAGNOSIS — R7303 Prediabetes: Secondary | ICD-10-CM | POA: Diagnosis not present

## 2021-03-26 DIAGNOSIS — R3916 Straining to void: Secondary | ICD-10-CM

## 2021-03-26 DIAGNOSIS — J470 Bronchiectasis with acute lower respiratory infection: Secondary | ICD-10-CM | POA: Diagnosis not present

## 2021-03-26 DIAGNOSIS — N401 Enlarged prostate with lower urinary tract symptoms: Secondary | ICD-10-CM

## 2021-03-26 DIAGNOSIS — Z13220 Encounter for screening for lipoid disorders: Secondary | ICD-10-CM | POA: Diagnosis not present

## 2021-03-26 DIAGNOSIS — Z131 Encounter for screening for diabetes mellitus: Secondary | ICD-10-CM | POA: Diagnosis not present

## 2021-03-26 DIAGNOSIS — E785 Hyperlipidemia, unspecified: Secondary | ICD-10-CM | POA: Diagnosis not present

## 2021-03-26 NOTE — Progress Notes (Signed)
? ?BP 112/72   Pulse 96   Temp 98.6 ?F (37 ?C) (Oral)   Resp 16   Ht '5\' 7"'$  (1.702 m)   Wt 194 lb 4.8 oz (88.1 kg)   SpO2 98%   BMI 30.43 kg/m?   ? ?Subjective:  ? ? Patient ID: George Davis, male    DOB: 1945-06-19, 76 y.o.   MRN: [id number] ? ?HPI: ?George BARTHELEMY is a 76 y.o. male ? ?Chief Complaint  ?Patient presents with  ? Establish Care  ? Pneumonia  ?  Follow up from ER  ? ?Establish care: Last physical was with Dr. Inis Sizer in March 2022. He says everything was normal at that appointment. ? ?Urgent care follow-up:  He was seen on 03/08/2021 for cough and nasal congestion.  His covid and flu tests were negative. He was treated empirically for pneumonia with Augmentin and azithromycin and was also given a steroid taper. He says he feels short of breath with activity.  He says he still has the cough, wakes up sweating, and he says he has no energy.  He says he has lost his appetite.  He has had a couple episodes of diarrhea, nausea and vomiting but have improved. He denies any chest pain. He says he completed antibiotics but still does not feel better.  Will get chest xray and lab work.  Gave him sample of spiriva, two puffs once a day.  Discussed rinsing mouth out after use.  If no improvement may need to send to pulmonology.  ? ?BPH: He says he has seen Dr. Erlene Quan in urology. He currently takes tamsulosin 0.4 mg daily.  He says he is doing well with this treatment plan and has no urinary symptoms.  ? ?Relevant past medical, surgical, family and social history reviewed and updated as indicated. Interim medical history since our last visit reviewed. ?Allergies and medications reviewed and updated. ? ?Review of Systems ? ?Constitutional: Negative for fever or weight change.  ?Respiratory: Positive for cough and shortness of breath.   ?Cardiovascular: Negative for chest pain or palpitations.  ?Gastrointestinal: Positive for generalized abdominal pain, nausea, vomiting and diarrhea  (improved) ?Musculoskeletal: Negative for gait problem or joint swelling.  ?Skin: Negative for rash.  ?Neurological: Negative for dizziness or headache.  ?No other specific complaints in a complete review of systems (except as listed in HPI above).  ? ?   ?Objective:  ?  ?BP 112/72   Pulse 96   Temp 98.6 ?F (37 ?C) (Oral)   Resp 16   Ht '5\' 7"'$  (1.702 m)   Wt 194 lb 4.8 oz (88.1 kg)   SpO2 98%   BMI 30.43 kg/m?   ?Wt Readings from Last 3 Encounters:  ?03/26/21 194 lb 4.8 oz (88.1 kg)  ?03/08/21 190 lb (86.2 kg)  ?01/13/21 190 lb 0.6 oz (86.2 kg)  ?  ?Physical Exam ? ?Constitutional: Patient appears well-developed and well-nourished. Obese  No distress.  ?HEENT: head atraumatic, normocephalic, pupils equal and reactive to light, neck supple ?Cardiovascular: Normal rate, regular rhythm and normal heart sounds.  No murmur heard. No BLE edema. ?Pulmonary/Chest: Effort normal and breath sounds rhonchi and wheezing. No respiratory distress. ?Abdominal: Soft.  There is no tenderness. ?Psychiatric: Patient has a normal mood and affect. behavior is normal. Judgment and thought content normal.  ? ?Results for orders placed or performed during the hospital encounter of 03/08/21  ?Resp Panel by RT-PCR (Flu A&B, Covid) Nasopharyngeal Swab  ? Specimen: Nasopharyngeal Swab; Nasopharyngeal(NP) swabs in  vial transport medium  ?Result Value Ref Range  ? SARS Coronavirus 2 by RT PCR NEGATIVE NEGATIVE  ? Influenza A by PCR NEGATIVE NEGATIVE  ? Influenza B by PCR NEGATIVE NEGATIVE  ? ?   ?Assessment & Plan:  ? ?1. Cough, persistent ?-gave sample of spiriva, take 2 puffs once a day, rinse mouth after use ?-continue using albuterol as needed ?- CBC with Differential/Platelet ?- Sedimentation rate ?- C-reactive protein ?- DG Chest 2 View; Future ? ?2. Benign prostatic hyperplasia (BPH) with straining on urination ?-continue current treatment plan ? ?3. Screening for diabetes mellitus ? ?- COMPLETE METABOLIC PANEL WITH GFR ?-  Hemoglobin A1c ? ?4. Screening for cholesterol level ? ?- Lipid panel ? ?5. Encounter to establish care ? ?-schedule cpe  ? ?Follow up plan: ?Return if symptoms worsen or fail to improve, for follow up if no improvement, otherwise schedule for cpe when due. ? ? ? ? ? ?

## 2021-03-27 LAB — HEMOGLOBIN A1C
Hgb A1c MFr Bld: 5.6 % of total Hgb (ref <5.7)
Mean Plasma Glucose: 114 mg/dL
eAG (mmol/L): 6.3 mmol/L

## 2021-03-27 LAB — CBC WITH DIFFERENTIAL/PLATELET
Absolute Monocytes: 533 cells/uL (ref 200–950)
Basophils Absolute: 31 cells/uL (ref 0–200)
Basophils Relative: 0.5 %
Eosinophils Absolute: 192 cells/uL (ref 15–500)
Eosinophils Relative: 3.1 %
HCT: 42 % (ref 38.5–50.0)
Hemoglobin: 13.7 g/dL (ref 13.2–17.1)
Lymphs Abs: 1178 cells/uL (ref 850–3900)
MCH: 28.1 pg (ref 27.0–33.0)
MCHC: 32.6 g/dL (ref 32.0–36.0)
MCV: 86.2 fL (ref 80.0–100.0)
MPV: 10.7 fL (ref 7.5–12.5)
Monocytes Relative: 8.6 %
Neutro Abs: 4266 cells/uL (ref 1500–7800)
Neutrophils Relative %: 68.8 %
Platelets: 316 10*3/uL (ref 140–400)
RBC: 4.87 10*6/uL (ref 4.20–5.80)
RDW: 13.6 % (ref 11.0–15.0)
Total Lymphocyte: 19 %
WBC: 6.2 10*3/uL (ref 3.8–10.8)

## 2021-03-27 LAB — SEDIMENTATION RATE: Sed Rate: 99 mm/h — ABNORMAL HIGH (ref 0–20)

## 2021-03-27 LAB — COMPLETE METABOLIC PANEL WITH GFR
AG Ratio: 1.4 (calc) (ref 1.0–2.5)
ALT: 22 U/L (ref 9–46)
AST: 26 U/L (ref 10–35)
Albumin: 3.7 g/dL (ref 3.6–5.1)
Alkaline phosphatase (APISO): 98 U/L (ref 35–144)
BUN: 17 mg/dL (ref 7–25)
CO2: 27 mmol/L (ref 20–32)
Calcium: 9.3 mg/dL (ref 8.6–10.3)
Chloride: 107 mmol/L (ref 98–110)
Creat: 1.23 mg/dL (ref 0.70–1.28)
Globulin: 2.6 g/dL (calc) (ref 1.9–3.7)
Glucose, Bld: 81 mg/dL (ref 65–99)
Potassium: 5.3 mmol/L (ref 3.5–5.3)
Sodium: 143 mmol/L (ref 135–146)
Total Bilirubin: 0.7 mg/dL (ref 0.2–1.2)
Total Protein: 6.3 g/dL (ref 6.1–8.1)
eGFR: 61 mL/min/{1.73_m2} (ref >=60)

## 2021-03-27 LAB — LIPID PANEL
Cholesterol: 157 mg/dL (ref <200)
HDL: 34 mg/dL — ABNORMAL LOW (ref >=40)
LDL Cholesterol (Calc): 99 mg/dL (calc)
Non-HDL Cholesterol (Calc): 123 mg/dL (calc) (ref <130)
Total CHOL/HDL Ratio: 4.6 (calc) (ref <5.0)
Triglycerides: 139 mg/dL (ref <150)

## 2021-03-27 LAB — C-REACTIVE PROTEIN: CRP: 105.8 mg/L — ABNORMAL HIGH (ref <8.0)

## 2021-04-05 ENCOUNTER — Telehealth: Payer: Self-pay

## 2021-04-05 NOTE — Telephone Encounter (Signed)
Pt returned our call. Share result note from Serafina Royals. Made follow up appointment for pt. ? ? ? ? ? ? ?Bo Merino, FNP  ?03/30/2021 12:20 PM EDT   ?  ?Iona Beard, your blood counts, kidney and liver function were normal. Your LDL which is your bad cholesterol is good.  Your chest xray did not show any pneumonia, your inflammation markers were elevated.  I would like you to come back for a one week recheck to see how you are doing.  I would like to listen to your lungs again and make sure you are improving and repeat some labs.  Almyra Free  ? ?

## 2021-04-08 ENCOUNTER — Other Ambulatory Visit: Payer: Self-pay

## 2021-04-08 ENCOUNTER — Ambulatory Visit (INDEPENDENT_AMBULATORY_CARE_PROVIDER_SITE_OTHER): Payer: PPO | Admitting: Nurse Practitioner

## 2021-04-08 ENCOUNTER — Encounter: Payer: Self-pay | Admitting: Nurse Practitioner

## 2021-04-08 VITALS — BP 124/82 | HR 98 | Temp 98.0°F | Resp 16 | Ht 67.0 in | Wt 198.5 lb

## 2021-04-08 DIAGNOSIS — R000 Tachycardia, unspecified: Secondary | ICD-10-CM | POA: Diagnosis not present

## 2021-04-08 DIAGNOSIS — R700 Elevated erythrocyte sedimentation rate: Secondary | ICD-10-CM

## 2021-04-08 DIAGNOSIS — R053 Chronic cough: Secondary | ICD-10-CM | POA: Diagnosis not present

## 2021-04-08 DIAGNOSIS — R7982 Elevated C-reactive protein (CRP): Secondary | ICD-10-CM | POA: Diagnosis not present

## 2021-04-08 NOTE — Progress Notes (Signed)
? ?BP 124/82   Pulse 98   Temp 98 ?F (36.7 ?C) (Oral)   Resp 16   Ht 5' 7" (1.702 m)   Wt 198 lb 8 oz (90 kg)   SpO2 95%   BMI 31.09 kg/m?   ? ?Subjective:  ? ? Patient ID: George Davis, male    DOB: 01/20/1945, 76 y.o.   MRN: [id number] ? ?HPI: ?George Davis is a 76 y.o. male ? ?Chief Complaint  ?Patient presents with  ? Follow-up  ? ?Persistent cough/racing heart rate: He was seen on 03/08/21 at urgent care for cough and nasal congestion.  He was tested for covid and flu and they were negative. They treated him empirically for pneumonia with Augmentin and azithromycin and was also given a steroid taper. He was still feeling short of breath and still had a cough on 03/26/2021 when he saw me.  We did an xray which was negative for pneumonia and did lab work.  His sed rate and crp were elevated but cbc was normal.  I gave him a sample of Spiriva to try and see if that helped with his breathing. He is back today for follow up. He says his cough is better, he is no longer wheezing at night. He says the Spiriva has really helped. He says he still gets winded when going up stairs but not as bad but now he is noticing that his heart is racing when he goes upstairs.  He says he has had this heart racing when going upstairs since being sick. He said before he was sick going up the stairs was not an issue. He denies any chest pain or palpitations. Discussed that it takes time to get over an illness it is likely due to his recent illness. Did a walking pulse ox and results are heart rate 73 and pulse ox 97%.  EKG performed and it was sinus rhythm.  Will place referral to pulmonology.  ? ? ?Relevant past medical, surgical, family and social history reviewed and updated as indicated. Interim medical history since our last visit reviewed. ?Allergies and medications reviewed and updated. ? ?Review of Systems ? ?Constitutional: Negative for fever or weight change.  ?Respiratory: Positive for cough and shortness of breath.    ?Cardiovascular: Negative for chest pain or palpitations. Positive for heart racing with activity ?Gastrointestinal: Negative for abdominal pain, no bowel changes.  ?Musculoskeletal: Negative for gait problem or joint swelling.  ?Skin: Negative for rash.  ?Neurological: Negative for dizziness or headache.  ?No other specific complaints in a complete review of systems (except as listed in HPI above).  ? ?   ?Objective:  ?  ?BP 124/82   Pulse 98   Temp 98 ?F (36.7 ?C) (Oral)   Resp 16   Ht 5' 7" (1.702 m)   Wt 198 lb 8 oz (90 kg)   SpO2 95%   BMI 31.09 kg/m?   ?Wt Readings from Last 3 Encounters:  ?04/08/21 198 lb 8 oz (90 kg)  ?03/26/21 194 lb 4.8 oz (88.1 kg)  ?03/08/21 190 lb (86.2 kg)  ?  ?Physical Exam ? ?Constitutional: Patient appears well-developed and well-nourished.  No distress.  ?HEENT: head atraumatic, normocephalic, pupils equal and reactive to light, neck supple ?Cardiovascular: Normal rate, regular rhythm and normal heart sounds.  No murmur heard. No BLE edema. ?Pulmonary/Chest: Effort normal and breath sounds normal. No respiratory distress. ?Abdominal: Soft.  There is no tenderness. ?Psychiatric: Patient has a normal mood and affect.  behavior is normal. Judgment and thought content normal.  ? ?Results for orders placed or performed in visit on 03/26/21  ?Lipid panel  ?Result Value Ref Range  ? Cholesterol 157 <200 mg/dL  ? HDL 34 (L) > OR = 40 mg/dL  ? Triglycerides 139 <150 mg/dL  ? LDL Cholesterol (Calc) 99 mg/dL (calc)  ? Total CHOL/HDL Ratio 4.6 <5.0 (calc)  ? Non-HDL Cholesterol (Calc) 123 <130 mg/dL (calc)  ?CBC with Differential/Platelet  ?Result Value Ref Range  ? WBC 6.2 3.8 - 10.8 Thousand/uL  ? RBC 4.87 4.20 - 5.80 Million/uL  ? Hemoglobin 13.7 13.2 - 17.1 g/dL  ? HCT 42.0 38.5 - 50.0 %  ? MCV 86.2 80.0 - 100.0 fL  ? MCH 28.1 27.0 - 33.0 pg  ? MCHC 32.6 32.0 - 36.0 g/dL  ? RDW 13.6 11.0 - 15.0 %  ? Platelets 316 140 - 400 Thousand/uL  ? MPV 10.7 7.5 - 12.5 fL  ? Neutro Abs 4,266  1,500 - 7,800 cells/uL  ? Lymphs Abs 1,178 850 - 3,900 cells/uL  ? Absolute Monocytes 533 200 - 950 cells/uL  ? Eosinophils Absolute 192 15 - 500 cells/uL  ? Basophils Absolute 31 0 - 200 cells/uL  ? Neutrophils Relative % 68.8 %  ? Total Lymphocyte 19.0 %  ? Monocytes Relative 8.6 %  ? Eosinophils Relative 3.1 %  ? Basophils Relative 0.5 %  ?COMPLETE METABOLIC PANEL WITH GFR  ?Result Value Ref Range  ? Glucose, Bld 81 65 - 99 mg/dL  ? BUN 17 7 - 25 mg/dL  ? Creat 1.23 0.70 - 1.28 mg/dL  ? eGFR 61 > OR = 60 mL/min/1.28m  ? BUN/Creatinine Ratio NOT APPLICABLE 6 - 22 (calc)  ? Sodium 143 135 - 146 mmol/L  ? Potassium 5.3 3.5 - 5.3 mmol/L  ? Chloride 107 98 - 110 mmol/L  ? CO2 27 20 - 32 mmol/L  ? Calcium 9.3 8.6 - 10.3 mg/dL  ? Total Protein 6.3 6.1 - 8.1 g/dL  ? Albumin 3.7 3.6 - 5.1 g/dL  ? Globulin 2.6 1.9 - 3.7 g/dL (calc)  ? AG Ratio 1.4 1.0 - 2.5 (calc)  ? Total Bilirubin 0.7 0.2 - 1.2 mg/dL  ? Alkaline phosphatase (APISO) 98 35 - 144 U/L  ? AST 26 10 - 35 U/L  ? ALT 22 9 - 46 U/L  ?Hemoglobin A1c  ?Result Value Ref Range  ? Hgb A1c MFr Bld 5.6 <5.7 % of total Hgb  ? Mean Plasma Glucose 114 mg/dL  ? eAG (mmol/L) 6.3 mmol/L  ?Sedimentation rate  ?Result Value Ref Range  ? Sed Rate 99 (H) 0 - 20 mm/h  ?C-reactive protein  ?Result Value Ref Range  ? CRP 105.8 (H) <8.0 mg/L  ? ?   ?Assessment & Plan:  ? ?1. Cough, persistent ?-continue spiriva ?- Ambulatory referral to Pulmonology ? ?2. Elevated C-reactive protein (CRP) ? ?- C-reactive protein ? ?3. Elevated sed rate ? ?- Sedimentation rate ? ?4. Racing heart beat ? ?- EKG 12-Lead  ? ?Follow up plan: ?Return in about 6 months (around 10/09/2021) for follow up. ? ? ? ? ? ?

## 2021-04-09 LAB — C-REACTIVE PROTEIN: CRP: 5 mg/L (ref <8.0)

## 2021-04-09 LAB — SEDIMENTATION RATE: Sed Rate: 17 mm/h (ref 0–20)

## 2021-05-06 ENCOUNTER — Encounter: Payer: Self-pay | Admitting: Pulmonary Disease

## 2021-05-06 ENCOUNTER — Ambulatory Visit (INDEPENDENT_AMBULATORY_CARE_PROVIDER_SITE_OTHER): Payer: PPO | Admitting: Pulmonary Disease

## 2021-05-06 ENCOUNTER — Other Ambulatory Visit
Admission: RE | Admit: 2021-05-06 | Discharge: 2021-05-06 | Disposition: A | Discharge status: Home / Self Care | Payer: PPO | Attending: Pulmonary Disease | Admitting: Pulmonary Disease

## 2021-05-06 VITALS — BP 124/70 | HR 83 | Temp 97.7°F | Ht 67.0 in | Wt 197.0 lb

## 2021-05-06 DIAGNOSIS — R0609 Other forms of dyspnea: Secondary | ICD-10-CM

## 2021-05-06 DIAGNOSIS — R058 Other specified cough: Secondary | ICD-10-CM | POA: Diagnosis not present

## 2021-05-06 LAB — COMPREHENSIVE METABOLIC PANEL
ALT: 24 U/L (ref 0–44)
AST: 26 U/L (ref 15–41)
Albumin: 4.2 g/dL (ref 3.5–5.0)
Alkaline Phosphatase: 90 U/L (ref 38–126)
Anion gap: 6 (ref 5–15)
BUN: 14 mg/dL (ref 8–23)
CO2: 25 mmol/L (ref 22–32)
Calcium: 9.1 mg/dL (ref 8.9–10.3)
Chloride: 110 mmol/L (ref 98–111)
Creatinine, Ser: 0.96 mg/dL (ref 0.61–1.24)
GFR, Estimated: >60 mL/min (ref >60)
Glucose, Bld: 107 mg/dL — ABNORMAL HIGH (ref 70–99)
Potassium: 4.5 mmol/L (ref 3.5–5.1)
Sodium: 141 mmol/L (ref 135–145)
Total Bilirubin: 0.9 mg/dL (ref 0.3–1.2)
Total Protein: 7.2 g/dL (ref 6.5–8.1)

## 2021-05-06 LAB — CBC WITH DIFFERENTIAL/PLATELET
Abs Immature Granulocytes: 0.02 10*3/uL (ref 0.00–0.07)
Basophils Absolute: 0 10*3/uL (ref 0.0–0.1)
Basophils Relative: 1 %
Eosinophils Absolute: 0.1 10*3/uL (ref 0.0–0.5)
Eosinophils Relative: 3 %
HCT: 45.3 % (ref 39.0–52.0)
Hemoglobin: 14.6 g/dL (ref 13.0–17.0)
Immature Granulocytes: 0 %
Lymphocytes Relative: 31 %
Lymphs Abs: 1.5 10*3/uL (ref 0.7–4.0)
MCH: 27.9 pg (ref 26.0–34.0)
MCHC: 32.2 g/dL (ref 30.0–36.0)
MCV: 86.5 fL (ref 80.0–100.0)
Monocytes Absolute: 0.3 10*3/uL (ref 0.1–1.0)
Monocytes Relative: 7 %
Neutro Abs: 2.9 10*3/uL (ref 1.7–7.7)
Neutrophils Relative %: 58 %
Platelets: 217 10*3/uL (ref 150–400)
RBC: 5.24 MIL/uL (ref 4.22–5.81)
RDW: 14.6 % (ref 11.5–15.5)
WBC: 5 10*3/uL (ref 4.0–10.5)
nRBC: 0 % (ref 0.0–0.2)

## 2021-05-06 LAB — D-DIMER, QUANTITATIVE: D-Dimer, Quant: 0.49 ug/mL-FEU (ref 0.00–0.50)

## 2021-05-06 LAB — BRAIN NATRIURETIC PEPTIDE: B Natriuretic Peptide: 19.8 pg/mL (ref 0.0–100.0)

## 2021-05-06 MED ORDER — TRELEGY ELLIPTA 100-62.5-25 MCG/ACT IN AEPB: 1.0000 | INHALATION_SPRAY | Freq: Every day | RESPIRATORY_TRACT | 0 refills | Status: DC

## 2021-05-06 MED ORDER — ALBUTEROL SULFATE HFA 108 (90 BASE) MCG/ACT IN AERS: 2.0000 | INHALATION_SPRAY | Freq: Four times a day (QID) | RESPIRATORY_TRACT | 5 refills | Status: AC | PRN

## 2021-05-06 MED ORDER — TRELEGY ELLIPTA 100-62.5-25 MCG/ACT IN AEPB: 1.0000 | INHALATION_SPRAY | Freq: Every day | RESPIRATORY_TRACT | 5 refills | Status: DC

## 2021-05-06 NOTE — Addendum Note (Signed)
Addended by: Antonieta Iba C on: 05/06/2021 10:42 AM ? ? Modules accepted: Orders ? ?

## 2021-05-06 NOTE — Patient Instructions (Signed)
Lab tests today ? ?Will arrange for pulmonary function test ? ?Trelegy one puff daily, and rinse your mouth after each use ? ?Albuterol two puffs every 6 hours as needed for cough, wheeze, shortness of breath or chest congestion ? ?Follow up in 4 to 6 weeks with Dr. Halford Chessman or Nurse Practitioner ?

## 2021-05-06 NOTE — Addendum Note (Signed)
Addended by: Antonieta Iba C on: 05/06/2021 10:43 AM ? ? Modules accepted: Orders ? ?

## 2021-05-06 NOTE — Progress Notes (Signed)
? ?[facility] ? ?Chief Complaint  ?Patient presents with  ? pulmonary consult  ?  Dx with PNA 02/2021--lingering prod cough with green sputum and SOB with exertion.   ? ? ?Past Surgical History:  ?He  has a past surgical history that includes Laminotomy / excision disk posterior cervical spine (2011); Sigmoidoscopy; RCR; Tonsillectomy; Shoulder surgery (Right, 1990); Hernia repair (Right, 1975); Cataract extraction w/PHACO (Left, 07/07/2015); Back surgery; Cataract extraction w/PHACO (Right, 08/11/2015); Carpal tunnel release (Left, 03/08/2017); Colonoscopy w/ polypectomy (01/03/2003); and Colonoscopy with propofol (N/A, 09/01/2017). ? ?Past Medical History:  ?BPH, Diverticulosis, HLD, COVID May 2022, Arthritis, Cataract ? ?Constitutional:  ?BP 124/70 (BP Location: Left Arm, Cuff Size: Normal)   Pulse 83   Temp 97.7 ?F (36.5 ?C) (Temporal)   Ht '5\' 7"'$  (1.702 m)   Wt 197 lb (89.4 kg)   SpO2 98%   BMI 30.85 kg/m?  ? ?Brief Summary:  ?George Davis is a 76 y.o. male former smoker with cough and dyspnea. ?  ? ? ? ?Subjective:  ? ?He developed cough and wheeze in February.  Was bringing up sputum.  Told he had pneumonia.  He went to ER and was treated with 7 days of augmentin and zithromax, and prednisone taper.  He had bronchitis in December 2022 and treated with prednisone, albuterol and augmentin.  He had COVID in May 2022.  He didn't have any specific therapy for COVID. ? ?He saw his PCP in March.  Started on spiriva.  Chest xray from 03/26/21 showed mild bronchitic changes at bases.  He felt much better when using spiriva, but script ran out and he didn't know if he could get it refilled. ? ?He is from [place].  He moved to [place] in the 1980's.  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  He denies occupational exposure to dust or fumes.  He had pneumonia when he was in the [employer].  His parents were smokers.  He has a International aid/development worker.  No family history of COPD  or asthma. ? ?He still has a cough with clear to green sputum.  He gets winded more when he is working around his house and feels his heart race.  He denies chest pain or dizziness.  No leg swelling, skin rash, or hemoptysis.  No issues with breathing or cough while asleep.  Not having allergy symptoms.  Denies allergies to foods or medications.   ? ? ?Physical Exam:  ? ?Appearance - well kempt  ? ?ENMT - no sinus tenderness, no oral exudate, no LAN, Mallampati 3 airway, no stridor ? ?Respiratory - equal breath sounds bilaterally, no wheezing or rales ? ?CV - s1s2 regular rate and rhythm, no murmurs ? ?Ext - no clubbing, no edema ? ?Skin - no rashes ? ?Psych - normal mood and affect ?  ?Pulmonary testing:  ? ? ?Chest Imaging:  ? ? ?Cardiac Tests:  ? ? ?Social History:  ?He  reports that he quit smoking about 26 years ago. His smoking use included cigarettes. He has a 28.00 pack-year smoking history. He has never used smokeless tobacco. He reports current alcohol use of about 2.0 standard drinks per week. He reports that he does not use drugs. ? ?Family History:  ?His family history includes Diabetes in his father; Prostate cancer in his father; Stomach cancer in his mother. ?  ? ?Discussion:  ?He has persistent productive cough and dyspnea on exertion.  This  seems to have started after he had COVID infection in May 2022.  Since then he had several episodes of bronchitis and possibly pneumonia.  I am concerned he could have developed an asthma syndrome after his COVID infection. ? ?Assessment/Plan:  ? ?Dyspnea on exertion with productive cough. ?- will check CBC with diff, IgE, CMET, BNP, D dimer ?- will arrange for PFT ?- trial of trelegy 100 one puff daily; sample given ?- albuterol prn ?- inhaler technique demonstrated ?- depending on test results and response to therapy, he might need further assessment with CT chest and/or cardiac assessment ? ?Time Spent Involved in Patient Care on Day of Examination:  ?49  minutes ? ?Follow up:  ? ?Patient Instructions  ?Lab tests today ? ?Will arrange for pulmonary function test ? ?Trelegy one puff daily, and rinse your mouth after each use ? ?Albuterol two puffs every 6 hours as needed for cough, wheeze, shortness of breath or chest congestion ? ?Follow up in 4 to 6 weeks with Dr. Halford Chessman or Nurse Practitioner ? ?Medication List:  ? ?Allergies as of 05/06/2021   ? ?   Reactions  ? Levonorgestrel-ethinyl Estrad Other (See Comments)  ? Watery eyes   ? ?  ? ?  ?Medication List  ?  ? ?  ? Accurate as of May 06, 2021 10:23 AM. If you have any questions, ask your nurse or doctor.  ?  ?  ? ?  ? ?albuterol 108 (90 Base) MCG/ACT inhaler ?Commonly known as: Ventolin HFA ?Inhale 2 puffs into the lungs every 6 (six) hours as needed for wheezing or shortness of breath. ?What changed:  ?how much to take ?when to take this ?Changed by: Chesley Mires, MD ?  ?aspirin 81 MG tablet ?Take 81 mg by mouth daily. ?  ?fluticasone 50 MCG/ACT nasal spray ?Commonly known as: FLONASE ?Place 1 spray into both nostrils 2 (two) times daily. ?What changed: how much to take ?  ?tamsulosin 0.4 MG Caps capsule ?Commonly known as: Flomax ?Take 1 capsule (0.4 mg total) by mouth 30 minutes after the same meal daily ?  ?Trelegy Ellipta 100-62.5-25 MCG/ACT Aepb ?Generic drug: Fluticasone-Umeclidin-Vilant ?Inhale 1 puff into the lungs daily in the afternoon. ?Started by: Chesley Mires, MD ?  ?Trelegy Ellipta 100-62.5-25 MCG/ACT Aepb ?Generic drug: Fluticasone-Umeclidin-Vilant ?Inhale 1 puff into the lungs daily. ?Started by: Chesley Mires, MD ?  ? ?  ? ? ?Signature:  ?Chesley Mires, MD ?[place] ?Pager - [phone] ?05/06/2021, 10:23 AM ?  ? ? ? ? ? ? ? ? ?

## 2021-05-08 LAB — IGE: IgE (Immunoglobulin E), Serum: 30 IU/mL (ref 6–495)

## 2021-05-14 ENCOUNTER — Telehealth: Payer: Self-pay

## 2021-05-14 NOTE — Telephone Encounter (Signed)
Copied from [place]. Topic: General - Other >> May 14, 2021 12:38 PM George Davis wrote: Reason for CRM: Patient called in stated he was returning Davis missed call from 9.56 AM. Nothing noted please advise and call patient Ph# [phone]

## 2021-05-17 NOTE — Telephone Encounter (Signed)
Do not see where we called patient ?

## 2021-06-04 ENCOUNTER — Telehealth: Payer: Self-pay | Admitting: Primary Care

## 2021-06-04 NOTE — Telephone Encounter (Signed)
Patient has OV 06/07/2021 and PFT 06/09/2021. If he is not having any new or worsening sx, he can postpone okay until after PFT.  Lm for patient.

## 2021-06-07 ENCOUNTER — Ambulatory Visit: Payer: PPO | Admitting: Primary Care

## 2021-06-07 NOTE — Telephone Encounter (Signed)
Lm x2 for patient.  Appt will need to be rescheduled due to provider being out of office.

## 2021-06-08 ENCOUNTER — Ambulatory Visit (INDEPENDENT_AMBULATORY_CARE_PROVIDER_SITE_OTHER): Payer: PPO | Admitting: Adult Health

## 2021-06-08 ENCOUNTER — Encounter: Payer: Self-pay | Admitting: Adult Health

## 2021-06-08 DIAGNOSIS — R053 Chronic cough: Secondary | ICD-10-CM | POA: Diagnosis not present

## 2021-06-08 NOTE — Progress Notes (Signed)
$'@Patient'h$  ID: George Davis, male    DOB: 07/23/1945, 76 y.o.   MRN: [id number]  Chief Complaint  Patient presents with   Follow-up    Referring provider: Bo Merino, FNP  HPI: 76 year old male seen for pulmonary consult May 06, 2021 for chronic cough  for months after Covid 19 infection May 2022 and Pneumonia Dec 2022   TEST/EVENTS :  CBC April 2023 showed eosinophils 100 absolute count, BNP D-dimer negative, IgE 30 Chest x-ray March 26, 2021 mild bronchitic changes  Jun 09, 2021 PFTs pending  06/08/2021 Follow up: Cough  Patient presents for a 1 month follow-up.  Patient was seen last visit for pulmonary consult for chronic cough.  Patient says he has had ongoing cough, wheezing for the last several months.  Patient had COVID-19 infection in May 2022.  He was treated for a bronchitis and pneumonia in December 2022.  He has had ongoing cough shortness of breath and intermittent wheezing.  Last visit patient was recommended to begin Trelegy.  Patient says he is unable to tolerate Trelegy.  Caused him to have dizziness.  He was set up for pulmonary function testing that has been set up for tomorrow.  Chest x-ray showed mild bronchitic changes.  Lab work was unrevealing with absolute eosinophil count at 100.  D-dimer and BNP were normal.  IgE was 30. Since last visit patient is feeling better , says that cough is almost totally gone.  Says that breathing is better.  Occasionally has some intermittent cough with some mucus but not very bad at all.  Very rare use of albuterol inhaler Patient says he is very active.  He is widowed, does all the housework and yard work.  He has a teenage daughter that is graduated from high school this year and going to [employer] at [place].  Allergies  Allergen Reactions   Levonorgestrel-Ethinyl Estrad Other (See Comments)    Watery eyes     Immunization History  Administered Date(s) Administered   Influenza Nasal 11/14/2014    Influenza, High Dose Seasonal PF 10/05/2012, 10/30/2020   Influenza,inj,Quad PF,6+ Mos 11/04/2019   Influenza-Unspecified 10/12/2011, 10/05/2012, 10/11/2013, 11/14/2014, 11/05/2015, 11/03/2016, 11/03/2017, 10/05/2018   PFIZER Comirnaty(Gray Top)Covid-19 Tri-Sucrose Vaccine 02/28/2019, 03/21/2019, 01/07/2020   Pneumococcal Conjugate-13 03/25/2016   Pneumococcal Polysaccharide-23 10/12/2010   Tdap 11/12/2012, 01/23/2014   Zoster Recombinat (Shingrix) 09/15/2017, 03/03/2018   Zoster, Live 10/11/2013    Past Medical History:  Diagnosis Date   Arthritis    fingers   BPH (benign prostatic hyperplasia)    Diverticulosis    Environmental and seasonal allergies    Hyperlipidemia    Pneumonia    HISTORY    Tobacco History: Social History   Tobacco Use  Smoking Status Former   Packs/day: 1.00   Years: 28.00   Pack years: 28.00   Types: Cigarettes   Quit date: 09/18/1994   Years since quitting: 26.7  Smokeless Tobacco Never   Counseling given: Not Answered   Outpatient Medications Prior to Visit  Medication Sig Dispense Refill   albuterol (VENTOLIN HFA) 108 (90 Base) MCG/ACT inhaler Inhale 2 puffs into the lungs every 6 (six) hours as needed for wheezing or shortness of breath. 1 each 5   aspirin 81 MG tablet Take 81 mg by mouth daily.     tamsulosin (FLOMAX) 0.4 MG CAPS capsule Take 1 capsule (0.4 mg total) by mouth 30 minutes after the same meal daily 90 capsule 4   Fluticasone-Umeclidin-Vilant (TRELEGY ELLIPTA)  100-62.5-25 MCG/ACT AEPB Inhale 1 puff into the lungs daily in the afternoon. 28 each 5   Fluticasone-Umeclidin-Vilant (TRELEGY ELLIPTA) 100-62.5-25 MCG/ACT AEPB Inhale 1 puff into the lungs daily. 14 each 0   fluticasone (FLONASE) 50 MCG/ACT nasal spray Place 1 spray into both nostrils 2 (two) times daily. (Patient taking differently: Place 2 sprays into both nostrils 2 (two) times daily. ) 16 g 0   No facility-administered medications prior to visit.     Review of  Systems:   Constitutional:   No  weight loss, night sweats,  Fevers, chills, fatigue, or  lassitude.  HEENT:   No headaches,  Difficulty swallowing,  Tooth/dental problems, or  Sore throat,                No sneezing, itching, ear ache, nasal congestion, post nasal drip,   CV:  No chest pain,  Orthopnea, PND, swelling in lower extremities, anasarca, dizziness, palpitations, syncope.   GI  No heartburn, indigestion, abdominal pain, nausea, vomiting, diarrhea, change in bowel habits, loss of appetite, bloody stools.   Resp: .  No chest wall deformity  Skin: no rash or lesions.  GU: no dysuria, change in color of urine, no urgency or frequency.  No flank pain, no hematuria   MS:  No joint pain or swelling.  No decreased range of motion.  No back pain.    Physical Exam  BP 132/76 (BP Location: Left Arm, Cuff Size: Normal)   Pulse 91   Temp 97.7 F (36.5 C) (Temporal)   Ht '5\' 7"'$  (1.702 m)   Wt 195 lb 3.2 oz (88.5 kg)   SpO2 100%   BMI 30.57 kg/m   GEN: A/Ox3; pleasant , NAD, well nourished    HEENT:  [place]/AT,  NOSE-clear, THROAT-clear, no lesions, no postnasal drip or exudate noted.   NECK:  Supple w/ fair ROM; no JVD; normal carotid impulses w/o bruits; no thyromegaly or nodules palpated; no lymphadenopathy.    RESP  Clear  P & A; w/o, wheezes/ rales/ or rhonchi. no accessory muscle use, no dullness to percussion  CARD:  RRR, no m/r/g, no peripheral edema, pulses intact, no cyanosis or clubbing.  GI:   Soft & nt; nml bowel sounds; no organomegaly or masses detected.   Musco: Warm bil, no deformities or joint swelling noted.   Neuro: alert, no focal deficits noted.    Skin: Warm, no lesions or rashes    Lab Results:       ProBNP No results found for: PROBNP  Imaging: No results found.        View : No data to display.          No results found for: NITRICOXIDE      Assessment & Plan:   Chronic cough Chronic cough most likely a post viral  cough syndrome now improved.  Patient may use Delsym as needed.  PFTs are pending.  Chest x-ray with no acute process.  Lab work was unrevealing with eosinophil and IgE.  Plan  Patient Instructions  Delsym 2 tsp Twice daily  for cough As needed   Ventolin inhaler As needed   PFT tomorrow as planned  Follow up with Dr. Halford Chessman in 6 months and As needed          Rexene Edison, NP 06/08/2021

## 2021-06-08 NOTE — Patient Instructions (Signed)
Delsym 2 tsp Twice daily  for cough As needed   Ventolin inhaler As needed   PFT tomorrow as planned  Follow up with Dr. Halford Chessman in 6 months and As needed

## 2021-06-08 NOTE — Assessment & Plan Note (Signed)
Chronic cough most likely a post viral cough syndrome now improved.  Patient may use Delsym as needed.  PFTs are pending.  Chest x-ray with no acute process.  Lab work was unrevealing with eosinophil and IgE.  Plan  Patient Instructions  Delsym 2 tsp Twice daily  for cough As needed   Ventolin inhaler As needed   PFT tomorrow as planned  Follow up with Dr. Halford Chessman in 6 months and As needed

## 2021-06-09 ENCOUNTER — Ambulatory Visit: Payer: PPO | Attending: Pulmonary Disease

## 2021-06-09 DIAGNOSIS — R0609 Other forms of dyspnea: Secondary | ICD-10-CM

## 2021-06-09 LAB — PULMONARY FUNCTION TEST ARMC ONLY
DL/VA % pred: 65 %
DL/VA: 2.61 ml/min/mmHg/L
DLCO unc % pred: 67 %
DLCO unc: 15.87 ml/min/mmHg
FEF 25-75 Post: 2.24 L/sec
FEF 25-75 Pre: 2.72 L/sec
FEF2575-%Change-Post: -17 %
FEF2575-%Pred-Post: 112 %
FEF2575-%Pred-Pre: 136 %
FEV1-%Change-Post: -2 %
FEV1-%Pred-Post: 88 %
FEV1-%Pred-Pre: 90 %
FEV1-Post: 2.45 L
FEV1-Pre: 2.51 L
FEV1FVC-%Change-Post: -11 %
FEV1FVC-%Pred-Pre: 115 %
FEV6-%Change-Post: 10 %
FEV6-%Pred-Post: 92 %
FEV6-%Pred-Pre: 82 %
FEV6-Post: 3.32 L
FEV6-Pre: 2.99 L
FEV6FVC-%Pred-Post: 107 %
FEV6FVC-%Pred-Pre: 107 %
FVC-%Change-Post: 10 %
FVC-%Pred-Post: 86 %
FVC-%Pred-Pre: 77 %
FVC-Post: 3.32 L
FVC-Pre: 2.99 L
Post FEV1/FVC ratio: 74 %
Post FEV6/FVC ratio: 100 %
Pre FEV1/FVC ratio: 84 %
Pre FEV6/FVC Ratio: 100 %
RV % pred: 84 %
RV: 2.09 L
TLC % pred: 84 %
TLC: 5.6 L

## 2021-06-09 MED ORDER — ALBUTEROL SULFATE (2.5 MG/3ML) 0.083% IN NEBU
2.5000 mg | INHALATION_SOLUTION | Freq: Once | RESPIRATORY_TRACT | Status: AC
  Administered 2021-06-09: 2.5 mg via RESPIRATORY_TRACT
  Filled 2021-06-09: qty 3

## 2021-06-16 DIAGNOSIS — H35373 Puckering of macula, bilateral: Secondary | ICD-10-CM | POA: Diagnosis not present

## 2021-06-16 DIAGNOSIS — Z961 Presence of intraocular lens: Secondary | ICD-10-CM | POA: Diagnosis not present

## 2021-06-16 DIAGNOSIS — M3501 Sicca syndrome with keratoconjunctivitis: Secondary | ICD-10-CM | POA: Diagnosis not present

## 2021-06-16 NOTE — Progress Notes (Signed)
Reviewed and agree with assessment/plan.   Chesley Mires, MD [facility] Pulmonary/Critical Care 06/16/2021, 12:48 PM Pager:  [phone]

## 2021-06-24 DIAGNOSIS — R202 Paresthesia of skin: Secondary | ICD-10-CM | POA: Diagnosis not present

## 2021-06-24 DIAGNOSIS — G5601 Carpal tunnel syndrome, right upper limb: Secondary | ICD-10-CM | POA: Diagnosis not present

## 2021-06-24 DIAGNOSIS — R200 Anesthesia of skin: Secondary | ICD-10-CM | POA: Diagnosis not present

## 2021-06-24 DIAGNOSIS — M79641 Pain in right hand: Secondary | ICD-10-CM | POA: Diagnosis not present

## 2021-07-19 DIAGNOSIS — R200 Anesthesia of skin: Secondary | ICD-10-CM | POA: Diagnosis not present

## 2021-07-19 DIAGNOSIS — R202 Paresthesia of skin: Secondary | ICD-10-CM | POA: Diagnosis not present

## 2021-08-16 DIAGNOSIS — G5601 Carpal tunnel syndrome, right upper limb: Secondary | ICD-10-CM | POA: Diagnosis not present

## 2021-09-13 ENCOUNTER — Ambulatory Visit: Payer: Self-pay | Admitting: *Deleted

## 2021-09-13 NOTE — Telephone Encounter (Signed)
  Chief Complaint: patient believes that he is experiencing a sinus infection Symptoms: ear fullness, crackling sound in bilateral ears with swallowing, productive cough with clear sputum Frequency: ongoing for a month now Pertinent Negatives: Patient denies any ear pain, drainage or other symptoms at this time.  Disposition: '[]'$ ED /'[]'$ Urgent Care (no appt availability in office) / '[x]'$ Appointment(In office/virtual)/ '[]'$  [place] [facility]/ '[]'$ Home Care/ '[]'$ Refused Recommended Disposition /'[]'$ [place] [facility]/ '[]'$  Follow-up with PCP Additional Notes: Patient scheduled for appt on 09/16/21 per his request due to schedule. Patient did mention that he was in a pool about a week and 1/2 ago, but symptoms were present before then. Patient has tried Claritin with little relief of symptoms. Patient advised to return call if worsening symptoms developed or if pain developed. Understanding verbalized.    Reason for Disposition  Ear congestion present > 48 hours  Answer Assessment - Initial Assessment Questions 1. LOCATION: "Which ear is involved?"       Both ers 2. SENSATION: "Describe how the ear feels." (e.g. stuffy, full, plugged)."      Feels full 3. ONSET:  "When did the ear symptoms start?"       About a month ago  4. PAIN: "Do you also have an earache?" If Yes, ask: "How bad is it?" (Scale 1-10; or mild, moderate, severe)     Deneis  5. CAUSE: "What do you think is causing the ear congestion?"     unsure 6. URI: "Do you have a runny nose or cough?"      Productive cough with clear sputum.  7. NASAL ALLERGIES: "Are there symptoms of hay fever, such as sneezing or a clear nasal discharge?"     no 8. PREGNANCY: "Is there any chance you are pregnant?" "When was your last menstrual period?"     N/a  Protocols used: Ear - Congestion-A-AH

## 2021-09-16 ENCOUNTER — Encounter: Payer: Self-pay | Admitting: Nurse Practitioner

## 2021-09-16 ENCOUNTER — Other Ambulatory Visit: Payer: Self-pay

## 2021-09-16 ENCOUNTER — Ambulatory Visit (INDEPENDENT_AMBULATORY_CARE_PROVIDER_SITE_OTHER): Payer: PPO | Admitting: Nurse Practitioner

## 2021-09-16 VITALS — BP 130/72 | HR 84 | Temp 98.1°F | Resp 16 | Ht 67.0 in | Wt 199.5 lb

## 2021-09-16 DIAGNOSIS — J0140 Acute pansinusitis, unspecified: Secondary | ICD-10-CM

## 2021-09-16 MED ORDER — AMOXICILLIN-POT CLAVULANATE 875-125 MG PO TABS: 1.0000 | ORAL_TABLET | Freq: Two times a day (BID) | ORAL | 0 refills | Status: DC

## 2021-09-16 NOTE — Progress Notes (Signed)
BP 130/72   Pulse 84   Temp 98.1 F (36.7 C) (Oral)   Resp 16   Ht '5\' 7"'$  (1.702 m)   Wt 199 lb 8 oz (90.5 kg)   SpO2 98%   BMI 31.25 kg/m    Subjective:    Patient ID: George Davis, male    DOB: May 29, 1945, 76 y.o.   MRN: [id number]  HPI: George Davis is a 76 y.o. male  Chief Complaint  Patient presents with   Ear Fullness    For 1 month   Sinus infection:  Patient reports that about a month ago he had a head cold. He says it started to get better but then came back with ear fullness, nasal congestion and drainage. Patient states he has tried zyrtec and it helps a little.  He denies any fever or shortness of breath.  Recommend he start taking zyrtec daily add Flonase and will send in antibiotic for sinus infection.  Relevant past medical, surgical, family and social history reviewed and updated as indicated. Interim medical history since our last visit reviewed. Allergies and medications reviewed and updated.  Review of Systems  Constitutional: Negative for fever or weight change.  HEENT: positive for ear fullness, nasal congestion and drainage Respiratory: Negative for cough and shortness of breath.   Cardiovascular: Negative for chest pain or palpitations.  Gastrointestinal: Negative for abdominal pain, no bowel changes.  Musculoskeletal: Negative for gait problem or joint swelling.  Skin: Negative for rash.  Neurological: Negative for dizziness or headache.  No other specific complaints in a complete review of systems (except as listed in HPI above).      Objective:    BP 130/72   Pulse 84   Temp 98.1 F (36.7 C) (Oral)   Resp 16   Ht '5\' 7"'$  (1.702 m)   Wt 199 lb 8 oz (90.5 kg)   SpO2 98%   BMI 31.25 kg/m   Wt Readings from Last 3 Encounters:  09/16/21 199 lb 8 oz (90.5 kg)  06/08/21 195 lb 3.2 oz (88.5 kg)  05/06/21 197 lb (89.4 kg)    Physical Exam  Constitutional: Patient appears well-developed and well-nourished. Obese  No distress.  HEENT: head  atraumatic, normocephalic, pupils equal and reactive to light, ears TMs clear, neck supple, throat within normal limits Cardiovascular: Normal rate, regular rhythm and normal heart sounds.  No murmur heard. No BLE edema. Pulmonary/Chest: Effort normal and breath sounds normal. No respiratory distress. Abdominal: Soft.  There is no tenderness. Psychiatric: Patient has a normal mood and affect. behavior is normal. Judgment and thought content normal.  Results for orders placed or performed in visit on 06/09/21  Pulmonary Function Test [facility] Only  Result Value Ref Range   FVC-Pre 2.99 L   FVC-%Pred-Pre 77 %   FVC-Post 3.32 L   FVC-%Pred-Post 86 %   FVC-%Change-Post 10 %   FEV1-Pre 2.51 L   FEV1-%Pred-Pre 90 %   FEV1-Post 2.45 L   FEV1-%Pred-Post 88 %   FEV1-%Change-Post -2 %   FEV6-Pre 2.99 L   FEV6-%Pred-Pre 82 %   FEV6-Post 3.32 L   FEV6-%Pred-Post 92 %   FEV6-%Change-Post 10 %   Pre FEV1/FVC ratio 84 %   FEV1FVC-%Pred-Pre 115 %   Post FEV1/FVC ratio 74 %   FEV1FVC-%Change-Post -11 %   Pre FEV6/FVC Ratio 100 %   FEV6FVC-%Pred-Pre 107 %   Post FEV6/FVC ratio 100 %   FEV6FVC-%Pred-Post 107 %   FEF 25-75 Pre 2.72  L/sec   FEF2575-%Pred-Pre 136 %   FEF 25-75 Post 2.24 L/sec   FEF2575-%Pred-Post 112 %   FEF2575-%Change-Post -17 %   RV 2.09 L   RV % pred 84 %   TLC 5.60 L   TLC % pred 84 %   DLCO unc 15.87 ml/min/mmHg   DLCO unc % pred 67 %   DL/VA 2.61 ml/min/mmHg/L   DL/VA % pred 65 %      Assessment & Plan:   Problem List Items Addressed This Visit   None Visit Diagnoses     Acute non-recurrent pansinusitis    -  Primary   continue taking zyrtec, start taking flonase and augmentin   Relevant Medications   amoxicillin-clavulanate (AUGMENTIN) 875-125 MG tablet        Follow up plan: Return if symptoms worsen or fail to improve.

## 2021-09-23 ENCOUNTER — Ambulatory Visit (INDEPENDENT_AMBULATORY_CARE_PROVIDER_SITE_OTHER): Payer: PPO | Admitting: Emergency Medicine

## 2021-09-23 ENCOUNTER — Ambulatory Visit
Admission: EM | Admit: 2021-09-23 | Discharge: 2021-09-23 | Disposition: A | Discharge status: Home / Self Care | Payer: PPO | Attending: Physician Assistant | Admitting: Physician Assistant

## 2021-09-23 DIAGNOSIS — W57XXXA Bitten or stung by nonvenomous insect and other nonvenomous arthropods, initial encounter: Secondary | ICD-10-CM | POA: Diagnosis not present

## 2021-09-23 DIAGNOSIS — R2242 Localized swelling, mass and lump, left lower limb: Secondary | ICD-10-CM | POA: Diagnosis not present

## 2021-09-23 DIAGNOSIS — Z23 Encounter for immunization: Secondary | ICD-10-CM | POA: Diagnosis not present

## 2021-09-23 DIAGNOSIS — M79672 Pain in left foot: Secondary | ICD-10-CM

## 2021-09-23 DIAGNOSIS — L03116 Cellulitis of left lower limb: Secondary | ICD-10-CM

## 2021-09-23 DIAGNOSIS — S90862A Insect bite (nonvenomous), left foot, initial encounter: Secondary | ICD-10-CM

## 2021-09-23 MED ORDER — AMOXICILLIN-POT CLAVULANATE 875-125 MG PO TABS: 1.0000 | ORAL_TABLET | Freq: Two times a day (BID) | ORAL | 0 refills | Status: AC

## 2021-09-23 MED ORDER — HYDROXYZINE HCL 25 MG PO TABS: ORAL_TABLET | ORAL | 0 refills | Status: DC

## 2021-09-23 MED ORDER — PREDNISONE 10 MG PO TABS: ORAL_TABLET | ORAL | 0 refills | Status: DC

## 2021-09-23 NOTE — ED Provider Notes (Signed)
[facility]    CSN: [id number] Arrival date & time: 09/23/21  1040      History   Chief Complaint Chief Complaint  Patient presents with   Insect Bite    HPI George Davis is a 76 y.o. male presenting for sudden onset of pain, swelling and redness of the left foot about the fifth digit today.  He says that he randomly noticed it.  He thinks something bit or stung him but does not recall feeling that happen or seeing a bug.  He has been wearing sandals and thinks something might of gotten in through his sandal to sting him.  He denies any pruritus.  He reports it does hurt to put weight on and walk.  He has no history of arthritis of the foot or gout.  He denies any recent injuries.  He has not had a lacerations, sores or wounds of any sort.  No fevers.  He has not treated condition in any way yet.  No similar problems in the past.  No other complaints.  HPI  Past Medical History:  Diagnosis Date   Arthritis    fingers   BPH (benign prostatic hyperplasia)    Diverticulosis    Environmental and seasonal allergies    Hyperlipidemia    Pneumonia    HISTORY    Patient Active Problem List   Diagnosis Date Noted   Chronic cough 06/08/2021   Allergic rhinitis 04/19/2017   Family history of prostate cancer 04/19/2017   H/O chronic sinusitis 04/19/2017   Abnormal electrocardiogram 03/13/2017   Primary osteoarthritis of first carpometacarpal joint of left hand 02/28/2017   Carpal tunnel syndrome of left wrist 02/14/2017   Chronic pain of left wrist 01/30/2017   Benign prostatic hyperplasia (BPH) with straining on urination 02/23/2016    Past Surgical History:  Procedure Laterality Date   BACK SURGERY     CARPAL TUNNEL RELEASE Left 03/08/2017   Procedure: CARPAL TUNNEL RELEASE;  Surgeon: Leanor Kail, MD;  Location: [facility] ORS;  Service: Orthopedics;  Laterality: Left;   CATARACT EXTRACTION W/PHACO Left 07/07/2015   Procedure: CATARACT EXTRACTION PHACO AND  INTRAOCULAR LENS PLACEMENT (IOC);  Surgeon: Birder Robson, MD;  Location: [facility] ORS;  Service: Ophthalmology;  Laterality: Left;  [place] 00:33.8 AP% 20.7 CDE 7.01 fluid pack lot #[id number]   CATARACT EXTRACTION W/PHACO Right 08/11/2015   Procedure: CATARACT EXTRACTION PHACO AND INTRAOCULAR LENS PLACEMENT ([place]);  Surgeon: Birder Robson, MD;  Location: [facility] ORS;  Service: Ophthalmology;  Laterality: Right;  [place] 49.0 AP% 17.8 CDE 8.73 Fluid Pack lot # [id number]   COLONOSCOPY W/ POLYPECTOMY  01/03/2003   12/04/2008, 01/01/2014   COLONOSCOPY WITH PROPOFOL N/A 09/01/2017   Procedure: COLONOSCOPY WITH PROPOFOL;  Surgeon: Manya Silvas, MD;  Location: [facility] ENDOSCOPY;  Service: Endoscopy;  Laterality: N/A;   HERNIA REPAIR Right 1975   inguinal, left in 1980   LAMINOTOMY / EXCISION DISK POSTERIOR CERVICAL SPINE  2011   metal in neck   RCR     SHOULDER SURGERY Right 1990   left done [place] Medications    Prior to Admission medications   Medication Sig Start Date End Date Taking? Authorizing Provider  hydrOXYzine (ATARAX) 25 MG tablet Take half a tablet to 1 tablet every 4 to 6 hours as needed allergic reaction 09/23/21  Yes Laurene Footman B, PA-C  predniSONE (DELTASONE) 10 MG tablet Take 6  tabs p.o. on day 1 and decrease by 1 tablet daily until complete 09/23/21  Yes Laurene Footman B, PA-C  albuterol (VENTOLIN HFA) 108 (90 Base) MCG/ACT inhaler Inhale 2 puffs into the lungs every 6 (six) hours as needed for wheezing or shortness of breath. 05/06/21   Chesley Mires, MD  aspirin 81 MG tablet Take 81 mg by mouth daily.    [provider]  tamsulosin (FLOMAX) 0.4 MG CAPS capsule Take 1 capsule (0.4 mg total) by mouth 30 minutes after the same meal daily 11/04/20       Family History Family History  Problem Relation Age of Onset   Stomach cancer Mother    Prostate cancer Father    Diabetes Father    Bladder Cancer Neg Hx    Kidney cancer Neg  Hx     Social History Social History   Tobacco Use   Smoking status: Former    Packs/day: 1.00    Years: 28.00    Total pack years: 28.00    Types: Cigarettes    Quit date: 09/18/1994    Years since quitting: 27.0   Smokeless tobacco: Never  Vaping Use   Vaping Use: Never used  Substance Use Topics   Alcohol use: Yes    Alcohol/week: 2.0 standard drinks of alcohol    Types: 2 Glasses of wine per week    Comment: occasionally   Drug use: Never     Allergies   Levonorgestrel-ethinyl estrad   Review of Systems Review of Systems  Musculoskeletal:  Positive for arthralgias and joint swelling.  Skin:  Positive for color change and rash. Negative for wound.  Neurological:  Negative for weakness and numbness.     Physical Exam Triage Vital Signs ED Triage Vitals  Enc Vitals Group     BP 09/23/21 1126 (!) 149/84     Pulse Rate 09/23/21 1126 (!) 101     Resp 09/23/21 1126 18     Temp 09/23/21 1126 98.6 F (37 C)     Temp Source 09/23/21 1126 Oral     SpO2 09/23/21 1126 96 %     Weight 09/23/21 1127 195 lb (88.5 kg)     Height 09/23/21 1127 '5\' 8"'$  (1.727 m)     Head Circumference --      Peak Flow --      Pain Score 09/23/21 1127 8     Pain Loc --      Pain Edu? --      Excl. in [place]? --    No data found.  Updated Vital Signs BP (!) 149/84 (BP Location: Left Arm)   Pulse (!) 101   Temp 98.6 F (37 C) (Oral)   Resp 18   Ht '5\' 8"'$  (1.727 m)   Wt 195 lb (88.5 kg)   SpO2 96%   BMI 29.65 kg/m     Physical Exam Vitals and nursing note reviewed.  Constitutional:      General: He is not in acute distress.    Appearance: Normal appearance. He is well-developed. He is not ill-appearing.  HENT:     Head: Normocephalic and atraumatic.  Eyes:     General: No scleral icterus.    Conjunctiva/sclera: Conjunctivae normal.  Cardiovascular:     Rate and Rhythm: Tachycardia present.  Pulmonary:     Effort: Pulmonary effort is normal. No respiratory distress.   Musculoskeletal:     Cervical back: Neck supple.  Skin:    General: Skin is warm and  dry.     Capillary Refill: Capillary refill takes less than 2 seconds.     Findings: Rash present.     Comments: LEFT FOOT: See image below.  Have drawn a line around the area of redness, swelling.  This affects the anterolateral forefoot about the fifth digit.  Patient has significant tenderness to palpation in this region especially at the distal fifth digit and fifth metatarsal.  No visible lesions, abrasions, lacerations or open wounds.  No skin breakdown.  No obvious papules or punctures.  Neurological:     General: No focal deficit present.     Mental Status: He is alert.     Motor: No weakness.     Gait: Gait normal.  Psychiatric:        Mood and Affect: Mood normal.        Behavior: Behavior normal.       UC Treatments / Results  Labs (all labs ordered are listed, but only abnormal results are displayed) Labs Reviewed - No data to display  EKG   Radiology No results found.  Procedures Procedures (including critical care time)  Medications Ordered in UC Medications - No data to display  Initial Impression / Assessment and Plan / UC Course  I have reviewed the triage vital signs and the nursing notes.  Pertinent labs & imaging results that were available during my care of the patient were reviewed by me and considered in my medical decision making (see chart for details).   76 year old male presenting for left foot pain, swelling and redness with sudden onset today.  Denies any pruritus.  He believes something bit or stung him.  I have included an image in the chart of patient's foot.  There is no sign of injury.  There is no evidence of skin breakdown or open wounds.  Given that it was/sudden onset I have lower suspicion for infectious cause.  It seems more likely that he was bitten or stung by something but I am unsure as to what.  We will treat as if it is localized reaction to an  insect.  Sent prednisone to pharmacy as well as hydroxyzine.  Also encouraged him to elevate and ice his extremity.  I did print a prescription for Augmentin in case he has no improvement in symptoms in the next couple of days or they worsen.  Advised him to start the antibiotic and follow-up with [place] especially if symptoms are worsening or he develops a fever or increased pain.  Patient is agreeable.   Final Clinical Impressions(s) / UC Diagnoses   Final diagnoses:  Left foot pain  Localized swelling of left foot  Insect bite of left foot, initial encounter  Cellulitis of left lower extremity     Discharge Instructions      -The redness, swelling and pain is most likely related to some sort of insect bite/sting since it was so sudden. - I have sent a corticosteroid taper for you to start.  You should also take the antihistamines as prescribed and ice and elevate your foot.  May take Tylenol for pain relief. - This should be improving over the next 24 to 48 hours and not worsening.  If you feel that it is worsening, fill the prescription for the antibiotic.  If that does not seem to make a difference in a couple of days or your condition is worsening or you develop fever, please return or go to emergency department for reevaluation.     ED  Prescriptions     Medication Sig Dispense Auth. Provider   predniSONE (DELTASONE) 10 MG tablet Take 6 tabs p.o. on day 1 and decrease by 1 tablet daily until complete 21 tablet Laurene Footman B, PA-C   hydrOXYzine (ATARAX) 25 MG tablet Take half a tablet to 1 tablet every 4 to 6 hours as needed allergic reaction 30 tablet Danton Clap, PA-C      PDMP not reviewed this encounter.   Laurene Footman B, PA-C 09/23/21 1200

## 2021-09-23 NOTE — ED Triage Notes (Signed)
Pt reports something bit me on the back of left small toe, top of foot is swollen x today.

## 2021-09-23 NOTE — Discharge Instructions (Addendum)
-  The redness, swelling and pain is most likely related to some sort of insect bite/sting since it was so sudden. - I have sent a corticosteroid taper for you to start.  You should also take the antihistamines as prescribed and ice and elevate your foot.  May take Tylenol for pain relief. - This should be improving over the next 24 to 48 hours and not worsening.  If you feel that it is worsening, fill the prescription for the antibiotic.  If that does not seem to make a difference in a couple of days or your condition is worsening or you develop fever, please return or go to emergency department for reevaluation.

## 2021-09-27 DIAGNOSIS — G5601 Carpal tunnel syndrome, right upper limb: Secondary | ICD-10-CM | POA: Diagnosis not present

## 2021-10-11 ENCOUNTER — Encounter: Payer: Self-pay | Admitting: Nurse Practitioner

## 2021-10-11 ENCOUNTER — Ambulatory Visit (INDEPENDENT_AMBULATORY_CARE_PROVIDER_SITE_OTHER): Payer: PPO | Admitting: Nurse Practitioner

## 2021-10-11 VITALS — BP 124/72 | HR 92 | Temp 97.9°F | Resp 18 | Ht 67.0 in | Wt 205.1 lb

## 2021-10-11 DIAGNOSIS — R053 Chronic cough: Secondary | ICD-10-CM | POA: Diagnosis not present

## 2021-10-11 DIAGNOSIS — N401 Enlarged prostate with lower urinary tract symptoms: Secondary | ICD-10-CM | POA: Diagnosis not present

## 2021-10-11 DIAGNOSIS — J309 Allergic rhinitis, unspecified: Secondary | ICD-10-CM | POA: Diagnosis not present

## 2021-10-11 DIAGNOSIS — R3916 Straining to void: Secondary | ICD-10-CM

## 2021-10-11 MED ORDER — CETIRIZINE HCL 10 MG PO TABS: 10.0000 mg | ORAL_TABLET | Freq: Every day | ORAL | 11 refills | Status: DC

## 2021-10-11 MED ORDER — FLUTICASONE PROPIONATE 50 MCG/ACT NA SUSP: 2.0000 | Freq: Every day | NASAL | 6 refills | Status: DC

## 2021-10-11 NOTE — Assessment & Plan Note (Signed)
Start using Flonase and Zyrtec daily.  If no improvement may need to send to ENT

## 2021-10-11 NOTE — Assessment & Plan Note (Signed)
He reports he does not have any symptoms as long as he remembers to take his Flomax 0.4 mg daily.

## 2021-10-11 NOTE — Assessment & Plan Note (Signed)
She reports his cough and breathing has gotten much better.  Patient has not had to use his albuterol inhaler in a while.  But he does have 1 if needed.  He is supposed to follow-up with pulmonology in November.

## 2021-10-11 NOTE — Progress Notes (Signed)
BP 124/72   Pulse 92   Temp 97.9 F (36.6 C)   Resp 18   Ht '5\' 7"'$  (1.702 m)   Wt 205 lb 1.6 oz (93 kg)   SpO2 95%   BMI 32.12 kg/m    Subjective:    Patient ID: George Davis, male    DOB: [birth year], 76 y.o.   MRN: [id number]  HPI: George Davis is a 76 y.o. male  Chief Complaint  Patient presents with   Follow-up   Benign Prostatic Hypertrophy   Allergic Rhinitis    Chronic cough: Patient was treated for pneumonia on 03/08/2021.  She had reported that since then he has had this cough.  He has since seen pulmonology on 4/20, 5/23. Patient has PFT done on 06/09/2021.  He reports he has been doing much better.  He is currently using albuterol as needed. He has not used his albuterol lately, but has been doing as much since he had carpal tunnel surgery.   BPH: He is currently being seen by urology, Dr. Erlene Quan.  He currently takes tamsulosin 0.4 mg daily.  He denies any urinary symptoms and reports he is doing well. He says he is doing well as long as he takes his medication.   Allergic sinusitis: Reports he feels like he is underwater.  Says this has been going on for a little bit.  He says he feels like he is got water in his ears.  He says 1 morning he did notice some blood out of his left ear.  Patient denies any trauma or recent illness.  Upon inspection no signs of any trauma.  Recommend taking Zyrtec and Flonase daily.  If no improvement will plan to send to ENT.  Relevant past medical, surgical, family and social history reviewed and updated as indicated. Interim medical history since our last visit reviewed. Allergies and medications reviewed and updated.  Review of Systems  Constitutional: Negative for fever or weight change.  Respiratory: Positive for cough and shortness of breath.   Cardiovascular: Negative for chest pain or palpitations.  Gastrointestinal: Positive for generalized abdominal pain, nausea, vomiting and diarrhea (improved) Musculoskeletal: Negative for  gait problem or joint swelling.  Skin: Negative for rash.  Neurological: Negative for dizziness or headache.  No other specific complaints in a complete review of systems (except as listed in HPI above).      Objective:    BP 124/72   Pulse 92   Temp 97.9 F (36.6 C)   Resp 18   Ht '5\' 7"'$  (1.702 m)   Wt 205 lb 1.6 oz (93 kg)   SpO2 95%   BMI 32.12 kg/m   Wt Readings from Last 3 Encounters:  10/11/21 205 lb 1.6 oz (93 kg)  09/23/21 195 lb (88.5 kg)  09/16/21 199 lb 8 oz (90.5 kg)    Physical Exam  Constitutional: Patient appears well-developed and well-nourished. Obese  No distress.  HEENT: head atraumatic, normocephalic, pupils equal and reactive to light, neck supple, TMs clear Cardiovascular: Normal rate, regular rhythm and normal heart sounds.  No murmur heard. No BLE edema. Pulmonary/Chest: Effort normal and breath sounds rhonchi and wheezing. No respiratory distress. Abdominal: Soft.  There is no tenderness. Psychiatric: Patient has a normal mood and affect. behavior is normal. Judgment and thought content normal.       Assessment & Plan:   Problem List Items Addressed This Visit       Respiratory   Allergic sinusitis  Start using Flonase and Zyrtec daily.  If no improvement may need to send to ENT      Relevant Medications   fluticasone (FLONASE) 50 MCG/ACT nasal spray   cetirizine (ZYRTEC) 10 MG tablet     Other   Benign prostatic hyperplasia (BPH) with straining on urination - Primary    He reports he does not have any symptoms as long as he remembers to take his Flomax 0.4 mg daily.      Cough, persistent    She reports his cough and breathing has gotten much better.  Patient has not had to use his albuterol inhaler in a while.  But he does have 1 if needed.  He is supposed to follow-up with pulmonology in November.        Follow up plan: Return in about 6 months (around 04/11/2022) for follow up.

## 2021-11-26 ENCOUNTER — Ambulatory Visit: Payer: PPO | Admitting: Primary Care

## 2021-11-29 DIAGNOSIS — R3916 Straining to void: Secondary | ICD-10-CM | POA: Diagnosis not present

## 2021-11-29 DIAGNOSIS — F325 Major depressive disorder, single episode, in full remission: Secondary | ICD-10-CM | POA: Diagnosis not present

## 2021-11-29 DIAGNOSIS — R5383 Other fatigue: Secondary | ICD-10-CM | POA: Diagnosis not present

## 2021-11-29 DIAGNOSIS — J45909 Unspecified asthma, uncomplicated: Secondary | ICD-10-CM | POA: Diagnosis not present

## 2021-11-29 DIAGNOSIS — Z0000 Encounter for general adult medical examination without abnormal findings: Secondary | ICD-10-CM | POA: Diagnosis not present

## 2021-11-29 DIAGNOSIS — N401 Enlarged prostate with lower urinary tract symptoms: Secondary | ICD-10-CM | POA: Diagnosis not present

## 2021-11-29 DIAGNOSIS — J309 Allergic rhinitis, unspecified: Secondary | ICD-10-CM | POA: Diagnosis not present

## 2021-11-30 DIAGNOSIS — E538 Deficiency of other specified B group vitamins: Secondary | ICD-10-CM | POA: Diagnosis not present

## 2021-11-30 DIAGNOSIS — R5383 Other fatigue: Secondary | ICD-10-CM | POA: Diagnosis not present

## 2021-11-30 DIAGNOSIS — Z0000 Encounter for general adult medical examination without abnormal findings: Secondary | ICD-10-CM | POA: Diagnosis not present

## 2022-02-07 ENCOUNTER — Telehealth: Payer: Self-pay | Admitting: Nurse Practitioner

## 2022-02-07 NOTE — Telephone Encounter (Signed)
LVM for pt to rtn my call to schedule AWV-I with [facility]. Please schedule if patient calls the office.

## 2022-02-25 ENCOUNTER — Ambulatory Visit: Payer: PPO

## 2022-03-08 ENCOUNTER — Telehealth: Payer: Self-pay | Admitting: Nurse Practitioner

## 2022-03-08 NOTE — Telephone Encounter (Signed)
Contacted George Davis to schedule their annual wellness visit. Appointment made for 04/14/2022.  [facility] Direct Dial: [phone]

## 2022-04-05 NOTE — Progress Notes (Deleted)
   There were no vitals taken for this visit.   Subjective:    Patient ID: MESHILEM SHURN, male    DOB: 04/05/1945, 77 y.o.   MRN: [id number]  HPI: BABOUCARR SINGERMAN is a 77 y.o. male  No chief complaint on file.  Chronic cough: Patient was treated for pneumonia on 03/08/2021.  He had reported that since then he has had this cough.  Patient has PFT done on 06/09/2021.  He reports he has been doing much better.  He is currently using albuterol as needed. He has not used his albuterol lately, but has been doing as much since he had carpal tunnel surgery.   BPH: He is currently being seen by urology, Dr. Erlene Quan.  He currently takes tamsulosin 0.4 mg daily.  He denies any urinary symptoms and reports he is doing well. He says he is doing well as long as he takes his medication.   Allergic sinusitis: Reports he feels like he is underwater.  Says this has been going on for a little bit.  He says he feels like he is got water in his ears.  He says 1 morning he did notice some blood out of his left ear.  Patient denies any trauma or recent illness.  Upon inspection no signs of any trauma.  Recommend taking Zyrtec and Flonase daily.  If no improvement will plan to send to ENT.  Relevant past medical, surgical, family and social history reviewed and updated as indicated. Interim medical history since our last visit reviewed. Allergies and medications reviewed and updated.  Review of Systems  Constitutional: Negative for fever or weight change.  Respiratory: Positive for cough and shortness of breath.   Cardiovascular: Negative for chest pain or palpitations.  Gastrointestinal: Positive for generalized abdominal pain, nausea, vomiting and diarrhea (improved) Musculoskeletal: Negative for gait problem or joint swelling.  Skin: Negative for rash.  Neurological: Negative for dizziness or headache.  No other specific complaints in a complete review of systems (except as listed in HPI above).       Objective:    There were no vitals taken for this visit.  Wt Readings from Last 3 Encounters:  10/11/21 205 lb 1.6 oz (93 kg)  09/23/21 195 lb (88.5 kg)  09/16/21 199 lb 8 oz (90.5 kg)    Physical Exam  Constitutional: Patient appears well-developed and well-nourished. Obese  No distress.  HEENT: head atraumatic, normocephalic, pupils equal and reactive to light, neck supple, TMs clear Cardiovascular: Normal rate, regular rhythm and normal heart sounds.  No murmur heard. No BLE edema. Pulmonary/Chest: Effort normal and breath sounds rhonchi and wheezing. No respiratory distress. Abdominal: Soft.  There is no tenderness. Psychiatric: Patient has a normal mood and affect. behavior is normal. Judgment and thought content normal.       Assessment & Plan:   Problem List Items Addressed This Visit   None    Follow up plan: No follow-ups on file.

## 2022-04-07 ENCOUNTER — Ambulatory Visit: Payer: No Typology Code available for payment source | Admitting: Nurse Practitioner

## 2022-04-14 ENCOUNTER — Ambulatory Visit (INDEPENDENT_AMBULATORY_CARE_PROVIDER_SITE_OTHER): Payer: No Typology Code available for payment source

## 2022-04-14 ENCOUNTER — Telehealth: Payer: Self-pay

## 2022-04-14 VITALS — Ht 68.0 in | Wt 195.0 lb

## 2022-04-14 DIAGNOSIS — Z0000 Encounter for general adult medical examination without abnormal findings: Secondary | ICD-10-CM | POA: Diagnosis not present

## 2022-04-14 NOTE — Patient Instructions (Signed)
Mr. George Davis , Thank you for taking time to come for your Medicare Wellness Visit. I appreciate your ongoing commitment to your health goals. Please review the following plan we discussed and let me know if I can assist you in the future.   These are the goals we discussed:  Goals   None     This is a list of the screening recommended for you and due dates:  Health Maintenance  Topic Date Due   Hepatitis C Screening: USPSTF Recommendation to screen - Ages 75-79 yo.  Never done   COVID-19 Vaccine (4 - 2023-24 season) 09/17/2021   Medicare Annual Wellness Visit  04/14/2023   DTaP/Tdap/Td vaccine (3 - Td or Tdap) 01/24/2024   Pneumonia Vaccine  Completed   Flu Shot  Completed   Zoster (Shingles) Vaccine  Completed   HPV Vaccine  Aged Out   Colon Cancer Screening  Discontinued    Advanced directives: yes  Conditions/risks identified: none  Next appointment: Follow up in one year for your annual wellness visit. 04/20/2023 @10 :15am telephone  Preventive Care 65 Years and Older, Male  Preventive care refers to lifestyle choices and visits with your health care provider that can promote health and wellness. What does preventive care include? A yearly physical exam. This is also called an annual well check. Dental exams once or twice a year. Routine eye exams. Ask your health care provider how often you should have your eyes checked. Personal lifestyle choices, including: Daily care of your teeth and gums. Regular physical activity. Eating a healthy diet. Avoiding tobacco and drug use. Limiting alcohol use. Practicing safe sex. Taking low doses of aspirin every day. Taking vitamin and mineral supplements as recommended by your health care provider. What happens during an annual well check? The services and screenings done by your health care provider during your annual well check will depend on your age, overall health, lifestyle risk factors, and family history of  disease. Counseling  Your health care provider may ask you questions about your: Alcohol use. Tobacco use. Drug use. Emotional well-being. Home and relationship well-being. Sexual activity. Eating habits. History of falls. Memory and ability to understand (cognition). Work and work Statistician. Screening  You may have the following tests or measurements: Height, weight, and BMI. Blood pressure. Lipid and cholesterol levels. These may be checked every 5 years, or more frequently if you are over 89 years old. Skin check. Lung cancer screening. You may have this screening every year starting at age 57 if you have a 30-pack-year history of smoking and currently smoke or have quit within the past 15 years. Fecal occult blood test (FOBT) of the stool. You may have this test every year starting at age 87. Flexible sigmoidoscopy or colonoscopy. You may have a sigmoidoscopy every 5 years or a colonoscopy every 10 years starting at age 37. Prostate cancer screening. Recommendations will vary depending on your family history and other risks. Hepatitis C blood test. Hepatitis B blood test. Sexually transmitted disease (STD) testing. Diabetes screening. This is done by checking your blood sugar (glucose) after you have not eaten for a while (fasting). You may have this done every 1-3 years. Abdominal aortic aneurysm (AAA) screening. You may need this if you are a current or former smoker. Osteoporosis. You may be screened starting at age 67 if you are at high risk. Talk with your health care provider about your test results, treatment options, and if necessary, the need for more tests. Vaccines  Your  health care provider may recommend certain vaccines, such as: Influenza vaccine. This is recommended every year. Tetanus, diphtheria, and acellular pertussis (Tdap, Td) vaccine. You may need a Td booster every 10 years. Zoster vaccine. You may need this after age 50. Pneumococcal 13-valent  conjugate (PCV13) vaccine. One dose is recommended after age 82. Pneumococcal polysaccharide (PPSV23) vaccine. One dose is recommended after age 83. Talk to your health care provider about which screenings and vaccines you need and how often you need them. This information is not intended to replace advice given to you by your health care provider. Make sure you discuss any questions you have with your health care provider. Document Released: 01/30/2015 Document Revised: 09/23/2015 Document Reviewed: 11/04/2014 Elsevier Interactive Patient Education  2017 [employer] Prevention in the Home Falls can cause injuries. They can happen to people of all ages. There are many things you can do to make your home safe and to help prevent falls. What can I do on the outside of my home? Regularly fix the edges of walkways and driveways and fix any cracks. Remove anything that might make you trip as you walk through a door, such as a raised step or threshold. Trim any bushes or trees on the path to your home. Use bright outdoor lighting. Clear any walking paths of anything that might make someone trip, such as rocks or tools. Regularly check to see if handrails are loose or broken. Make sure that both sides of any steps have handrails. Any raised decks and porches should have guardrails on the edges. Have any leaves, snow, or ice cleared regularly. Use sand or salt on walking paths during winter. Clean up any spills in your garage right away. This includes oil or grease spills. What can I do in the bathroom? Use night lights. Install grab bars by the toilet and in the tub and shower. Do not use towel bars as grab bars. Use non-skid mats or decals in the tub or shower. If you need to sit down in the shower, use a plastic, non-slip stool. Keep the floor dry. Clean up any water that spills on the floor as soon as it happens. Remove soap buildup in the tub or shower regularly. Attach bath mats  securely with double-sided non-slip rug tape. Do not have throw rugs and other things on the floor that can make you trip. What can I do in the bedroom? Use night lights. Make sure that you have a light by your bed that is easy to reach. Do not use any sheets or blankets that are too big for your bed. They should not hang down onto the floor. Have a firm chair that has side arms. You can use this for support while you get dressed. Do not have throw rugs and other things on the floor that can make you trip. What can I do in the kitchen? Clean up any spills right away. Avoid walking on wet floors. Keep items that you use a lot in easy-to-reach places. If you need to reach something above you, use a strong step stool that has a grab bar. Keep electrical cords out of the way. Do not use floor polish or wax that makes floors slippery. If you must use wax, use non-skid floor wax. Do not have throw rugs and other things on the floor that can make you trip. What can I do with my stairs? Do not leave any items on the stairs. Make sure that there are handrails  on both sides of the stairs and use them. Fix handrails that are broken or loose. Make sure that handrails are as long as the stairways. Check any carpeting to make sure that it is firmly attached to the stairs. Fix any carpet that is loose or worn. Avoid having throw rugs at the top or bottom of the stairs. If you do have throw rugs, attach them to the floor with carpet tape. Make sure that you have a light switch at the top of the stairs and the bottom of the stairs. If you do not have them, ask someone to add them for you. What else can I do to help prevent falls? Wear shoes that: Do not have high heels. Have rubber bottoms. Are comfortable and fit you well. Are closed at the toe. Do not wear sandals. If you use a stepladder: Make sure that it is fully opened. Do not climb a closed stepladder. Make sure that both sides of the stepladder  are locked into place. Ask someone to hold it for you, if possible. Clearly mark and make sure that you can see: Any grab bars or handrails. First and last steps. Where the edge of each step is. Use tools that help you move around (mobility aids) if they are needed. These include: Canes. Walkers. Scooters. Crutches. Turn on the lights when you go into a dark area. Replace any light bulbs as soon as they burn out. Set up your furniture so you have a clear path. Avoid moving your furniture around. If any of your floors are uneven, fix them. If there are any pets around you, be aware of where they are. Review your medicines with your doctor. Some medicines can make you feel dizzy. This can increase your chance of falling. Ask your doctor what other things that you can do to help prevent falls. This information is not intended to replace advice given to you by your health care provider. Make sure you discuss any questions you have with your health care provider. Document Released: 10/30/2008 Document Revised: 06/11/2015 Document Reviewed: 02/07/2014 Elsevier Interactive Patient Education  2017 [employer].

## 2022-04-14 NOTE — Telephone Encounter (Signed)
I called back and reached pt for AWV (completed)
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
## 2022-05-26 DIAGNOSIS — Z6831 Body mass index (BMI) 31.0-31.9, adult: Secondary | ICD-10-CM | POA: Diagnosis not present

## 2022-05-26 DIAGNOSIS — M1711 Unilateral primary osteoarthritis, right knee: Secondary | ICD-10-CM | POA: Diagnosis not present

## 2022-05-26 DIAGNOSIS — M25561 Pain in right knee: Secondary | ICD-10-CM | POA: Diagnosis not present

## 2022-05-26 DIAGNOSIS — M222X1 Patellofemoral disorders, right knee: Secondary | ICD-10-CM | POA: Diagnosis not present

## 2022-05-26 DIAGNOSIS — G8929 Other chronic pain: Secondary | ICD-10-CM | POA: Diagnosis not present

## 2022-06-30 DIAGNOSIS — H43813 Vitreous degeneration, bilateral: Secondary | ICD-10-CM | POA: Diagnosis not present

## 2022-06-30 DIAGNOSIS — M3501 Sicca syndrome with keratoconjunctivitis: Secondary | ICD-10-CM | POA: Diagnosis not present

## 2022-06-30 DIAGNOSIS — H40003 Preglaucoma, unspecified, bilateral: Secondary | ICD-10-CM | POA: Diagnosis not present

## 2022-06-30 DIAGNOSIS — H35379 Puckering of macula, unspecified eye: Secondary | ICD-10-CM | POA: Diagnosis not present

## 2022-09-09 DIAGNOSIS — K5730 Diverticulosis of large intestine without perforation or abscess without bleeding: Secondary | ICD-10-CM | POA: Diagnosis not present

## 2022-09-09 DIAGNOSIS — Z86010 Personal history of colonic polyps: Secondary | ICD-10-CM | POA: Diagnosis not present

## 2022-09-09 DIAGNOSIS — K5520 Angiodysplasia of colon without hemorrhage: Secondary | ICD-10-CM | POA: Diagnosis not present

## 2022-10-07 DIAGNOSIS — L821 Other seborrheic keratosis: Secondary | ICD-10-CM | POA: Diagnosis not present

## 2022-10-07 DIAGNOSIS — D225 Melanocytic nevi of trunk: Secondary | ICD-10-CM | POA: Diagnosis not present

## 2022-10-07 DIAGNOSIS — L728 Other follicular cysts of the skin and subcutaneous tissue: Secondary | ICD-10-CM | POA: Diagnosis not present

## 2022-10-07 DIAGNOSIS — L820 Inflamed seborrheic keratosis: Secondary | ICD-10-CM | POA: Diagnosis not present

## 2022-10-07 DIAGNOSIS — L298 Other pruritus: Secondary | ICD-10-CM | POA: Diagnosis not present

## 2022-10-07 DIAGNOSIS — R208 Other disturbances of skin sensation: Secondary | ICD-10-CM | POA: Diagnosis not present

## 2022-11-10 DIAGNOSIS — Z23 Encounter for immunization: Secondary | ICD-10-CM | POA: Diagnosis not present

## 2022-11-16 ENCOUNTER — Encounter: Payer: Self-pay | Admitting: Internal Medicine

## 2022-11-29 ENCOUNTER — Encounter: Payer: Self-pay | Admitting: Internal Medicine

## 2022-11-29 ENCOUNTER — Ambulatory Visit: Payer: No Typology Code available for payment source | Admitting: Anesthesiology

## 2022-11-29 ENCOUNTER — Ambulatory Visit
Admission: RE | Admit: 2022-11-29 | Discharge: 2022-11-29 | Disposition: A | Discharge status: Home / Self Care | Payer: No Typology Code available for payment source | Attending: Internal Medicine | Admitting: Internal Medicine

## 2022-11-29 ENCOUNTER — Encounter
Admission: RE | Disposition: A | Discharge status: Home / Self Care | Payer: Self-pay | Source: Home / Self Care | Attending: Internal Medicine

## 2022-11-29 DIAGNOSIS — K640 First degree hemorrhoids: Secondary | ICD-10-CM | POA: Diagnosis not present

## 2022-11-29 DIAGNOSIS — Z1211 Encounter for screening for malignant neoplasm of colon: Secondary | ICD-10-CM | POA: Insufficient documentation

## 2022-11-29 DIAGNOSIS — K5730 Diverticulosis of large intestine without perforation or abscess without bleeding: Secondary | ICD-10-CM | POA: Diagnosis not present

## 2022-11-29 DIAGNOSIS — Z860101 Personal history of adenomatous and serrated colon polyps: Secondary | ICD-10-CM | POA: Diagnosis not present

## 2022-11-29 DIAGNOSIS — K635 Polyp of colon: Secondary | ICD-10-CM | POA: Insufficient documentation

## 2022-11-29 DIAGNOSIS — Z87891 Personal history of nicotine dependence: Secondary | ICD-10-CM | POA: Insufficient documentation

## 2022-11-29 HISTORY — PX: COLONOSCOPY WITH PROPOFOL: SHX5780

## 2022-11-29 HISTORY — PX: POLYPECTOMY: SHX5525

## 2022-11-29 SURGERY — COLONOSCOPY WITH PROPOFOL
Anesthesia: General

## 2022-11-29 MED ORDER — PROPOFOL 500 MG/50ML IV EMUL
INTRAVENOUS | Status: DC | PRN
  Administered 2022-11-29: 100 ug/kg/min via INTRAVENOUS
  Administered 2022-11-29: 70 mg via INTRAVENOUS

## 2022-11-29 MED ORDER — PROPOFOL 10 MG/ML IV BOLUS
INTRAVENOUS | Status: AC
  Filled 2022-11-29: qty 80

## 2022-11-29 MED ORDER — SODIUM CHLORIDE 0.9 % IV SOLN: INTRAVENOUS | Status: DC

## 2022-11-29 NOTE — Transfer of Care (Signed)
Immediate Anesthesia Transfer of Care Note  Patient: George Davis  Procedure(s) Performed: COLONOSCOPY WITH PROPOFOL  Patient Location: PACU and Endoscopy Unit  Anesthesia Type:General  Level of Consciousness: sedated, drowsy, and patient cooperative  Airway & Oxygen Therapy: Patient Spontanous Breathing  Post-op Assessment: Report given to RN and Post -op Vital signs reviewed and stable  Post vital signs: Reviewed and stable  Last Vitals:  Vitals Value Taken Time  BP 101/66 11/29/22 0930  Temp 36.1 C 11/29/22 0929  Pulse 71 11/29/22 0931  Resp 17 11/29/22 0931  SpO2 95 % 11/29/22 0931  Vitals shown include unfiled device data.  Last Pain:  Vitals:   11/29/22 0929  TempSrc: Temporal  PainSc: Asleep         Complications: No notable events documented.

## 2022-11-29 NOTE — Op Note (Signed)
[facility] Gastroenterology Patient Name: Devang Seccombe Procedure Date: 11/29/2022 8:57 AM MRN: [id number] Account #: [id number] Date of Birth: [birth year] Admit Type: Outpatient Age: 77 Room: [facility] ENDO ROOM 1 Gender: Male Note Status: Finalized Instrument Name: Prentice Docker [id number] Procedure:             Colonoscopy Indications:           Surveillance: Personal history of adenomatous polyps                         on last colonoscopy > 5 years ago Providers:             Royce Macadamia K. Orlin Kann MD, MD Medicines:             Propofol per Anesthesia Complications:         No immediate complications. Estimated blood loss: None. Procedure:             Pre-Anesthesia Assessment:                        - The risks and benefits of the procedure and the                         sedation options and risks were discussed with the                         patient. All questions were answered and informed                         consent was obtained.                        - Patient identification and proposed procedure were                         verified prior to the procedure by the nurse. The                         procedure was verified in the procedure room.                        - ASA Grade Assessment: III - A patient with severe                         systemic disease.                        - After reviewing the risks and benefits, the patient                         was deemed in satisfactory condition to undergo the                         procedure.                        After obtaining informed consent, the colonoscope was                         passed under direct vision. Throughout the procedure,  the patient's blood pressure, pulse, and oxygen                         saturations were monitored continuously. The                         Colonoscope was introduced through the anus and                         advanced to the the cecum,  identified by appendiceal                         orifice and ileocecal valve. The colonoscopy was                         technically difficult and complex due to restricted                         mobility of the colon. Successful completion of the                         procedure was aided by straightening and shortening                         the scope to obtain bowel loop reduction. The patient                         tolerated the procedure well. The quality of the bowel                         preparation was good except the sigmoid colon was                         poor. The ileocecal valve, appendiceal orifice, and                         rectum were photographed. Findings:      The perianal and digital rectal examinations were normal. Pertinent       negatives include normal sphincter tone and no palpable rectal lesions.      Non-bleeding internal hemorrhoids were found during retroflexion. The       hemorrhoids were Grade I (internal hemorrhoids that do not prolapse).      Many large-mouthed and medium-mouthed diverticula were found in the       entire colon. There was no evidence of diverticular bleeding.      A 7 mm polyp was found in the ascending colon. The polyp was sessile.       The polyp was removed with a cold snare. Resection was complete, but the       polyp tissue was not retrieved. Estimated blood loss: none.      A large amount of liquid semi-solid stool was found in the recto-sigmoid       colon and in the sigmoid colon, interfering with visualization. Lavage       of the area was performed using a moderate amount of sterile water,       resulting in clearance with fair visualization.      The exam was otherwise without abnormality. Impression:            -  Non-bleeding internal hemorrhoids.                        - Moderate diverticulosis in the entire examined                         colon. There was no evidence of diverticular bleeding.                         - One 7 mm polyp in the ascending colon, removed with                         a cold snare. Complete resection. Polyp tissue not                         retrieved.                        - Stool in the recto-sigmoid colon and in the sigmoid                         colon.                        - The examination was otherwise normal. Recommendation:        - Patient has a contact number available for                         emergencies. The signs and symptoms of potential                         delayed complications were discussed with the patient.                         Return to normal activities tomorrow. Written                         discharge instructions were provided to the patient.                        - Resume previous diet.                        - Continue present medications.                        - Repeat colonoscopy in 3 years for surveillance.                        - Return to GI office PRN.                        - The findings and recommendations were discussed with                         the patient. Procedure Code(s):     --- Professional ---                        [id number], Colonoscopy, flexible; with removal of  tumor(s), polyp(s), or other lesion(s) by snare                         technique Diagnosis Code(s):     --- Professional ---                        K57.30, Diverticulosis of large intestine without                         perforation or abscess without bleeding                        D12.2, Benign neoplasm of ascending colon                        K64.0, First degree hemorrhoids                        Z86.010, Personal history of colonic polyps CPT copyright 2022 American Medical Association. All rights reserved. The codes documented in this report are preliminary and upon coder review may  be revised to meet current compliance requirements. Stanton Kidney MD, MD 11/29/2022 9:31:14 AM This report has been signed  electronically. Number of Addenda: 0 Note Initiated On: 11/29/2022 8:57 AM Scope Withdrawal Time: 0 hours 5 minutes 14 seconds  Total Procedure Duration: 0 hours 15 minutes 17 seconds  Estimated Blood Loss:  Estimated blood loss: none. Estimated blood loss: none.      [facility]

## 2022-11-29 NOTE — H&P (Signed)
Outpatient short stay form Pre-procedure 11/29/2022 8:29 AM Ranyia Witting K. Norma Fredrickson, M.D.  Primary Physician: Maudie Flakes, M.D.  Reason for visit:  Personal history of adenomatous colon polyps  History of present illness:                            Patient presents for colonoscopy for a personal hx of colon polyps. The patient denies abdominal pain, abnormal weight loss or rectal bleeding.  Last colonoscopy performed 08/2017 by Dr. Mechele Collin showed two diminutive polyps removed from ascending colon with path showing tubular adenoma and hyperplastic polyp. There was also mention of bleeding AVM in ascending colon s/p APC treatment and one hemostatic clip placed. He presents to the clinic to schedule repeat colonoscopy     Current Facility-Administered Medications:    0.9 %  sodium chloride infusion, , Intravenous, Continuous, [place], Boykin Nearing, MD, Last Rate: 20 mL/hr at 11/29/22 0810, New Bag at 11/29/22 0810  Medications Prior to Admission  Medication Sig Dispense Refill Last Dose   albuterol (VENTOLIN HFA) 108 (90 Base) MCG/ACT inhaler Inhale 2 puffs into the lungs every 6 (six) hours as needed for wheezing or shortness of breath. 1 each 5 Past Month   aspirin 81 MG tablet Take 81 mg by mouth daily.   Past Week   tamsulosin (FLOMAX) 0.4 MG CAPS capsule Take 1 capsule (0.4 mg total) by mouth 30 minutes after the same meal daily 90 capsule 4 Past Week   cetirizine (ZYRTEC) 10 MG tablet Take 1 tablet (10 mg total) by mouth daily. 30 tablet 11    fluticasone (FLONASE) 50 MCG/ACT nasal spray Place 2 sprays into both nostrils daily. 16 g 6    hydrOXYzine (ATARAX) 25 MG tablet Take half a tablet to 1 tablet every 4 to 6 hours as needed allergic reaction 30 tablet 0    tiotropium (SPIRIVA) 18 MCG inhalation capsule Place 18 mcg into inhaler and inhale daily. (Patient not taking: Reported on 11/29/2022)   Not Taking     Allergies  Allergen Reactions   Levonorgestrel-Ethinyl Estrad Other (See  Comments)    Watery eyes      Past Medical History:  Diagnosis Date   Arthritis    fingers   BPH (benign prostatic hyperplasia)    Diverticulosis    Environmental and seasonal allergies    Hyperlipidemia    Pneumonia    HISTORY    Review of systems:  Otherwise negative.    Physical Exam  Gen: Alert, oriented. Appears stated age.  HEENT: [place]/AT. PERRLA. Lungs: CTA, no wheezes. CV: RR nl S1, S2. Abd: soft, benign, no masses. BS+ Ext: No edema. Pulses 2+    Planned procedures: Proceed with colonoscopy. The patient understands the nature of the planned procedure, indications, risks, alternatives and potential complications including but not limited to bleeding, infection, perforation, damage to internal organs and possible oversedation/side effects from anesthesia. The patient agrees and gives consent to proceed.  Please refer to procedure notes for findings, recommendations and patient disposition/instructions.     Jas Betten K. Norma Fredrickson, M.D. Gastroenterology 11/29/2022  8:29 AM

## 2022-11-29 NOTE — Anesthesia Postprocedure Evaluation (Signed)
Anesthesia Post Note  Patient: George Davis  Procedure(s) Performed: COLONOSCOPY WITH PROPOFOL POLYPECTOMY  Patient location during evaluation: Endoscopy Anesthesia Type: General Level of consciousness: awake and alert Pain management: pain level controlled Vital Signs Assessment: post-procedure vital signs reviewed and stable Respiratory status: spontaneous breathing, nonlabored ventilation, respiratory function stable and patient connected to nasal cannula oxygen Cardiovascular status: blood pressure returned to baseline and stable Postop Assessment: no apparent nausea or vomiting Anesthetic complications: no   No notable events documented.   Last Vitals:  Vitals:   11/29/22 0947 11/29/22 0949  BP:  120/83  Pulse:    Resp: 16 20  Temp:    SpO2:  97%    Last Pain:  Vitals:   11/29/22 0949  TempSrc:   PainSc: 0-No pain                 Louie Boston

## 2022-11-29 NOTE — Anesthesia Preprocedure Evaluation (Signed)
Anesthesia Evaluation  Patient identified by MRN, date of birth, ID band Patient awake    Reviewed: Allergy & Precautions, NPO status , Patient's Chart, lab work & pertinent test results  History of Anesthesia Complications Negative for: history of anesthetic complications  Airway Mallampati: III  TM Distance: >3 FB Neck ROM: full    Dental no notable dental hx.    Pulmonary neg pulmonary ROS, former smoker   Pulmonary exam normal        Cardiovascular negative cardio ROS Normal cardiovascular exam     Neuro/Psych  Neuromuscular disease  negative psych ROS   GI/Hepatic negative GI ROS, Neg liver ROS,,,  Endo/Other  negative endocrine ROS    Renal/GU negative Renal ROS  negative genitourinary   Musculoskeletal   Abdominal   Peds  Hematology negative hematology ROS (+)   Anesthesia Other Findings Past Medical History: No date: Arthritis     Comment:  fingers No date: BPH (benign prostatic hyperplasia) No date: Diverticulosis No date: Environmental and seasonal allergies No date: Hyperlipidemia No date: Pneumonia     Comment:  HISTORY  Past Surgical History: No date: BACK SURGERY 03/08/2017: CARPAL TUNNEL RELEASE; Left     Comment:  Procedure: CARPAL TUNNEL RELEASE;  Surgeon: Erin Sons, MD;  Location: [facility];  Service: Orthopedics;                Laterality: Left; 07/07/2015: CATARACT EXTRACTION W/PHACO; Left     Comment:  Procedure: CATARACT EXTRACTION PHACO AND INTRAOCULAR               LENS PLACEMENT (IOC);  Surgeon: Galen Manila, MD;                Location: [facility];  Service: Ophthalmology;  Laterality:              Left;  [place] 00:33.8 AP% 20.7 CDE 7.01 fluid pack lot               #[id number] 08/11/2015: CATARACT EXTRACTION W/PHACO; Right     Comment:  Procedure: CATARACT EXTRACTION PHACO AND INTRAOCULAR               LENS PLACEMENT (IOC);  Surgeon: Galen Manila, MD;                 Location: [facility];  Service: Ophthalmology;  Laterality:              Right;  [place] 49.0 AP% 17.8 CDE 8.73 Fluid Pack lot #               [id number] 01/03/2003: COLONOSCOPY W/ POLYPECTOMY     Comment:  12/04/2008, 01/01/2014 09/01/2017: COLONOSCOPY WITH PROPOFOL; N/A     Comment:  Procedure: COLONOSCOPY WITH PROPOFOL;  Surgeon: Scot Jun, MD;  Location: [facility];  Service:               Endoscopy;  Laterality: N/A; No date: EYE SURGERY 1975: HERNIA REPAIR; Right     Comment:  inguinal, left in 1980 2011: LAMINOTOMY / EXCISION DISK POSTERIOR CERVICAL SPINE     Comment:  metal in neck No date: RCR 1990: SHOULDER SURGERY; Right     Comment:  left done 1985 No date: SIGMOIDOSCOPY No date: TONSILLECTOMY  BMI    Body Mass Index: 30.11 kg/m  Reproductive/Obstetrics negative OB ROS                             Anesthesia Physical Anesthesia Plan  ASA: 2  Anesthesia Plan: General   Post-op Pain Management: Minimal or no pain anticipated   Induction: Intravenous  PONV Risk Score and Plan: 1 and Propofol infusion and TIVA  Airway Management Planned: Natural Airway and Nasal Cannula  Additional Equipment:   Intra-op Plan:   Post-operative Plan:   Informed Consent: I have reviewed the patients History and Physical, chart, labs and discussed the procedure including the risks, benefits and alternatives for the proposed anesthesia with the patient or authorized representative who has indicated his/her understanding and acceptance.     Dental Advisory Given  Plan Discussed with: Anesthesiologist, CRNA and Surgeon  Anesthesia Plan Comments: (Patient consented for risks of anesthesia including but not limited to:  - adverse reactions to medications - risk of airway placement if required - damage to eyes, teeth, lips or other oral mucosa - nerve damage due to positioning  - sore throat or hoarseness -  Damage to heart, brain, nerves, lungs, other parts of body or loss of life  Patient voiced understanding and assent.)       Anesthesia Quick Evaluation

## 2022-11-29 NOTE — Interval H&P Note (Signed)
History and Physical Interval Note:  11/29/2022 8:30 AM  George Davis  has presented today for surgery, with the diagnosis of V12.72 (ICD-9-CM) - Z86.010 (ICD-10-CM) - Hx of adenomatous colonic polyps.  The various methods of treatment have been discussed with the patient and family. After consideration of risks, benefits and other options for treatment, the patient has consented to  Procedure(s): COLONOSCOPY WITH PROPOFOL (N/A) as a surgical intervention.  The patient's history has been reviewed, patient examined, no change in status, stable for surgery.  I have reviewed the patient's chart and labs.  Questions were answered to the patient's satisfaction.     [place], [place]

## 2022-11-30 ENCOUNTER — Encounter: Payer: Self-pay | Admitting: Internal Medicine

## 2022-12-12 DIAGNOSIS — R3916 Straining to void: Secondary | ICD-10-CM | POA: Diagnosis not present

## 2022-12-12 DIAGNOSIS — J309 Allergic rhinitis, unspecified: Secondary | ICD-10-CM | POA: Diagnosis not present

## 2022-12-12 DIAGNOSIS — E538 Deficiency of other specified B group vitamins: Secondary | ICD-10-CM | POA: Diagnosis not present

## 2022-12-12 DIAGNOSIS — N529 Male erectile dysfunction, unspecified: Secondary | ICD-10-CM | POA: Diagnosis not present

## 2022-12-12 DIAGNOSIS — N401 Enlarged prostate with lower urinary tract symptoms: Secondary | ICD-10-CM | POA: Diagnosis not present

## 2022-12-12 DIAGNOSIS — Z0000 Encounter for general adult medical examination without abnormal findings: Secondary | ICD-10-CM | POA: Diagnosis not present

## 2022-12-12 DIAGNOSIS — F325 Major depressive disorder, single episode, in full remission: Secondary | ICD-10-CM | POA: Diagnosis not present

## 2022-12-12 DIAGNOSIS — J45909 Unspecified asthma, uncomplicated: Secondary | ICD-10-CM | POA: Diagnosis not present

## 2022-12-30 ENCOUNTER — Ambulatory Visit
Admission: EM | Admit: 2022-12-30 | Discharge: 2022-12-30 | Disposition: A | Discharge status: Home / Self Care | Payer: No Typology Code available for payment source | Attending: Family Medicine | Admitting: Family Medicine

## 2022-12-30 ENCOUNTER — Encounter: Payer: Self-pay | Admitting: Emergency Medicine

## 2022-12-30 DIAGNOSIS — J069 Acute upper respiratory infection, unspecified: Secondary | ICD-10-CM | POA: Diagnosis not present

## 2022-12-30 LAB — GROUP A STREP BY PCR: Group A Strep by PCR: NOT DETECTED

## 2022-12-30 LAB — SARS CORONAVIRUS 2 BY RT PCR: SARS Coronavirus 2 by RT PCR: NEGATIVE

## 2022-12-30 MED ORDER — IPRATROPIUM BROMIDE 0.06 % NA SOLN: 2.0000 | Freq: Four times a day (QID) | NASAL | 12 refills | Status: DC

## 2022-12-30 NOTE — Discharge Instructions (Addendum)
Your COVID and strep tests are negative.  I sent a nasal spray that will help with congestion to your pharmacy.  Stop by and pick the medication up.   You can take Tylenol and/or Ibuprofen as needed for fever reduction and pain relief.    For cough: honey 1/2 to 1 teaspoon (you can dilute the honey in water or another fluid).  You can also use guaifenesin and dextromethorphan for cough. You can use a humidifier for chest congestion and cough.  If you don't have a humidifier, you can sit in the bathroom with the hot shower running.      For sore throat: try warm salt water gargles, Mucinex sore throat cough drops or cepacol lozenges, throat spray, warm tea or water with lemon/honey, popsicles or ice, or OTC cold relief medicine for throat discomfort. You can also purchase chloraseptic spray at the pharmacy or dollar store.   For congestion: take a daily anti-histamine like Zyrtec, Claritin, and a oral decongestant, such as pseudoephedrine.  You can also use Flonase 1-2 sprays in each nostril daily. Afrin is also a good option, if you do not have high blood pressure.    It is important to stay hydrated: drink plenty of fluids (water, gatorade/powerade/pedialyte, juices, or teas) to keep your throat moisturized and help further relieve irritation/discomfort.    Return or go to the Emergency Department if symptoms worsen or do not improve in the next few days

## 2022-12-30 NOTE — ED Provider Notes (Signed)
[facility]    CSN: [id number] Arrival date & time: 12/30/22  0946      History   Chief Complaint Chief Complaint  Patient presents with   Sinus Problem   Sore Throat   Cough    HPI George Davis is a 77 y.o. male.   HPI  History obtained from the patient. George Davis presents for sinus congestion, sore throat, cough with body aches that started 4 days ago.  No known sick contacts.  No fevers but has chills. Denies vomiting, diarrhea and headache. Tried Zrytec and nasal spray  without relief.   Past Medical History:  Diagnosis Date   Arthritis    fingers   BPH (benign prostatic hyperplasia)    Diverticulosis    Environmental and seasonal allergies    Hyperlipidemia    Pneumonia    HISTORY    Patient Active Problem List   Diagnosis Date Noted   Cough, persistent 06/08/2021   Allergic sinusitis 04/19/2017   Family history of prostate cancer 04/19/2017   H/O chronic sinusitis 04/19/2017   Abnormal electrocardiogram 03/13/2017   Primary osteoarthritis of first carpometacarpal joint of left hand 02/28/2017   Carpal tunnel syndrome of left wrist 02/14/2017   Chronic pain of left wrist 01/30/2017   Benign prostatic hyperplasia (BPH) with straining on urination 02/23/2016    Past Surgical History:  Procedure Laterality Date   BACK SURGERY     CARPAL TUNNEL RELEASE Left 03/08/2017   Procedure: CARPAL TUNNEL RELEASE;  Surgeon: Erin Sons, MD;  Location: [facility];  Service: Orthopedics;  Laterality: Left;   CATARACT EXTRACTION W/PHACO Left 07/07/2015   Procedure: CATARACT EXTRACTION PHACO AND INTRAOCULAR LENS PLACEMENT (IOC);  Surgeon: Galen Manila, MD;  Location: [facility];  Service: Ophthalmology;  Laterality: Left;  [place] 00:33.8 AP% 20.7 CDE 7.01 fluid pack lot #[id number]   CATARACT EXTRACTION W/PHACO Right 08/11/2015   Procedure: CATARACT EXTRACTION PHACO AND INTRAOCULAR LENS PLACEMENT (IOC);  Surgeon: Galen Manila, MD;  Location: [facility];   Service: Ophthalmology;  Laterality: Right;  [place] 49.0 AP% 17.8 CDE 8.73 Fluid Pack lot # [id number]   COLONOSCOPY W/ POLYPECTOMY  01/03/2003   12/04/2008, 01/01/2014   COLONOSCOPY WITH PROPOFOL N/A 09/01/2017   Procedure: COLONOSCOPY WITH PROPOFOL;  Surgeon: Scot Jun, MD;  Location: [facility];  Service: Endoscopy;  Laterality: N/A;   COLONOSCOPY WITH PROPOFOL N/A 11/29/2022   Procedure: COLONOSCOPY WITH PROPOFOL;  Surgeon: Toledo, Boykin Nearing, MD;  Location: [facility] END[facility];  Service: Gastroenterology;  Laterality: N/A;   EYE SURGERY     HERNIA REPAIR Right 1975   inguinal, left in 1980   LAMINOTOMY / EXCISION DISK POSTERIOR CERVICAL SPINE  2011   metal in neck   POLYPECTOMY  11/29/2022   Procedure: POLYPECTOMY;  Surgeon: Toledo, Boykin Nearing, MD;  Location: [facility] ENDOSCOPY;  Service: Gastroenterology;;   RCR     SHOULDER SURGERY Right 1990   left done 1985   SIGMOIDOSCOPY     TONSILLECTOMY         Home Medications    Prior to Admission medications   Medication Sig Start Date End Date Taking? Authorizing Provider  ipratropium (ATROVENT) 0.06 % nasal spray Place 2 sprays into both nostrils 4 (four) times daily. 12/30/22  Yes Summerlyn Fickel, DO  albuterol (VENTOLIN HFA) 108 (90 Base) MCG/ACT inhaler Inhale 2 puffs into the lungs every 6 (six) hours as needed for wheezing or shortness of breath. 05/06/21   Coralyn Helling, MD  aspirin 81 MG tablet  Take 81 mg by mouth daily.    [provider]  cetirizine (ZYRTEC) 10 MG tablet Take 1 tablet (10 mg total) by mouth daily. 10/11/21   Berniece Salines, FNP  cyanocobalamin (VITAMIN B12) 1000 MCG tablet Take by mouth.    [provider]  fluticasone (FLONASE) 50 MCG/ACT nasal spray Place 2 sprays into both nostrils daily. 10/11/21   Berniece Salines, FNP  hydrOXYzine (ATARAX) 25 MG tablet Take half a tablet to 1 tablet every 4 to 6 hours as needed allergic reaction 09/23/21   Shirlee Latch, PA-C  tamsulosin [facility]) 0.4 MG  CAPS capsule Take 1 capsule (0.4 mg total) by mouth 30 minutes after the same meal daily 11/04/20     tiotropium (SPIRIVA) 18 MCG inhalation capsule Place 18 mcg into inhaler and inhale daily. Patient not taking: Reported on 11/29/2022 11/29/21   [provider]    Family History Family History  Problem Relation Age of Onset   Stomach cancer Mother    Prostate cancer Father    Diabetes Father    Bladder Cancer Neg Hx    Kidney cancer Neg Hx     Social History Social History   Tobacco Use   Smoking status: Former    Current packs/day: 0.00    Average packs/day: 1 pack/day for 28.0 years (28.0 ttl pk-yrs)    Types: Cigarettes    Start date: 09/18/1966    Quit date: 09/18/1994    Years since quitting: 28.3   Smokeless tobacco: Never  Vaping Use   Vaping status: Never Used  Substance Use Topics   Alcohol use: Yes    Alcohol/week: 2.0 standard drinks of alcohol    Types: 2 Glasses of wine per week    Comment: occasionally   Drug use: Never     Allergies   Levonorgestrel-ethinyl estrad   Review of Systems Review of Systems: negative unless otherwise stated in HPI.      Physical Exam Triage Vital Signs ED Triage Vitals  Encounter Vitals Group     BP 12/30/22 1018 138/83     Systolic BP Percentile --      Diastolic BP Percentile --      Pulse Rate 12/30/22 1018 77     Resp 12/30/22 1018 15     Temp 12/30/22 1018 98.3 F (36.8 C)     Temp Source 12/30/22 1018 Oral     SpO2 12/30/22 1018 95 %     Weight 12/30/22 1016 197 lb 15.6 oz (89.8 kg)     Height 12/30/22 1016 5\' 8"  (1.727 m)     Head Circumference --      Peak Flow --      Pain Score 12/30/22 1016 9     Pain Loc --      Pain Education --      Exclude from Growth Chart --    No data found.  Updated Vital Signs BP 138/83 (BP Location: Left Arm)   Pulse 77   Temp 98.3 F (36.8 C) (Oral)   Resp 15   Ht 5\' 8"  (1.727 m)   Wt 89.8 kg   SpO2 95%   BMI 30.10 kg/m   Visual Acuity Right Eye  Distance:   Left Eye Distance:   Bilateral Distance:    Right Eye Near:   Left Eye Near:    Bilateral Near:     Physical Exam GEN:     alert, ill but non-toxic appearing elderly male  HENT:  mucus membranes moist, oropharyngeal without lesions or erythema, no tonsillar hypertrophy or exudates,  moderate erythematous edematous turbinates, clear nasal discharge, bilateral TM normal though left TM obscured by cerumen EYES:  no scleral injection or discharge RESP:  no increased work of breathing, clear to auscultation bilaterally CVS:   regular rate and rhythm Skin:   warm and dry    UC Treatments / Results  Labs (all labs ordered are listed, but only abnormal results are displayed) Labs Reviewed  GROUP A STREP BY PCR  SARS CORONAVIRUS 2 BY RT PCR    EKG   Radiology No results found.  Procedures Procedures (including critical care time)  Medications Ordered in UC Medications - No data to display  Initial Impression / Assessment and Plan / UC Course  I have reviewed the triage vital signs and the nursing notes.  Pertinent labs & imaging results that were available during my care of the patient were reviewed by me and considered in my medical decision making (see chart for details).       Pt is a 77 y.o. male who presents for 4 days of respiratory symptoms. Jasan is afebrile here without recent antipyretics. Satting 95%  on room air.  He is a former smoker.  Overall pt is ill but non-toxic appearing, well hydrated, without respiratory distress. Pulmonary exam is unremarkable.  COVID testing obtained and was negative.  Strep PCR obtained and was negative.    History most consistent with viral respiratory illness. Discussed symptomatic treatment.  Explained lack of efficacy of antibiotics in viral disease.  Typical duration of symptoms discussed.  Has nasal congestion bothers him most.  Treat with Atrovent nasal spray.  Return and ED precautions given and voiced  understanding. Discussed MDM, treatment plan and plan for follow-up with patient who agrees with plan.     Final Clinical Impressions(s) / UC Diagnoses   Final diagnoses:  Viral URI with cough     Discharge Instructions      Your COVID and strep tests are negative.  I sent a nasal spray that will help with congestion to your pharmacy.  Stop by and pick the medication up.   You can take Tylenol and/or Ibuprofen as needed for fever reduction and pain relief.    For cough: honey 1/2 to 1 teaspoon (you can dilute the honey in water or another fluid).  You can also use guaifenesin and dextromethorphan for cough. You can use a humidifier for chest congestion and cough.  If you don't have a humidifier, you can sit in the bathroom with the hot shower running.      For sore throat: try warm salt water gargles, Mucinex sore throat cough drops or cepacol lozenges, throat spray, warm tea or water with lemon/honey, popsicles or ice, or OTC cold relief medicine for throat discomfort. You can also purchase chloraseptic spray at the pharmacy or dollar store.   For congestion: take a daily anti-histamine like Zyrtec, Claritin, and a oral decongestant, such as pseudoephedrine.  You can also use Flonase 1-2 sprays in each nostril daily. Afrin is also a good option, if you do not have high blood pressure.    It is important to stay hydrated: drink plenty of fluids (water, gatorade/powerade/pedialyte, juices, or teas) to keep your throat moisturized and help further relieve irritation/discomfort.    Return or go to the Emergency Department if symptoms worsen or do not improve in the next few days      ED Prescriptions  Medication Sig Dispense Auth. Provider   ipratropium (ATROVENT) 0.06 % nasal spray Place 2 sprays into both nostrils 4 (four) times daily. 15 mL Katha Cabal, DO      PDMP not reviewed this encounter.   Katha Cabal, DO 12/30/22 1528

## 2022-12-30 NOTE — ED Triage Notes (Signed)
Patient reports cough, nasal congestion, and sore throat that started 4 days ago.  Patient states that he is now having sinus pressure and congestion.  Patient unsure of fevers.

## 2023-02-20 DIAGNOSIS — M25312 Other instability, left shoulder: Secondary | ICD-10-CM | POA: Diagnosis not present

## 2023-02-20 DIAGNOSIS — S46002A Unspecified injury of muscle(s) and tendon(s) of the rotator cuff of left shoulder, initial encounter: Secondary | ICD-10-CM | POA: Diagnosis not present

## 2023-02-20 DIAGNOSIS — M25512 Pain in left shoulder: Secondary | ICD-10-CM | POA: Diagnosis not present

## 2023-02-23 ENCOUNTER — Other Ambulatory Visit: Payer: Self-pay | Admitting: Orthopedic Surgery

## 2023-02-23 DIAGNOSIS — S46002A Unspecified injury of muscle(s) and tendon(s) of the rotator cuff of left shoulder, initial encounter: Secondary | ICD-10-CM

## 2023-03-07 ENCOUNTER — Ambulatory Visit
Admission: RE | Admit: 2023-03-07 | Discharge: 2023-03-07 | Disposition: A | Discharge status: Home / Self Care | Payer: HMO | Source: Ambulatory Visit | Attending: Orthopedic Surgery | Admitting: Orthopedic Surgery

## 2023-03-07 DIAGNOSIS — S46012A Strain of muscle(s) and tendon(s) of the rotator cuff of left shoulder, initial encounter: Secondary | ICD-10-CM | POA: Diagnosis not present

## 2023-03-07 DIAGNOSIS — M25512 Pain in left shoulder: Secondary | ICD-10-CM | POA: Diagnosis not present

## 2023-03-07 DIAGNOSIS — M19012 Primary osteoarthritis, left shoulder: Secondary | ICD-10-CM | POA: Diagnosis not present

## 2023-03-07 DIAGNOSIS — S46002A Unspecified injury of muscle(s) and tendon(s) of the rotator cuff of left shoulder, initial encounter: Secondary | ICD-10-CM

## 2023-03-07 DIAGNOSIS — M7552 Bursitis of left shoulder: Secondary | ICD-10-CM | POA: Diagnosis not present

## 2023-03-23 DIAGNOSIS — S46002A Unspecified injury of muscle(s) and tendon(s) of the rotator cuff of left shoulder, initial encounter: Secondary | ICD-10-CM | POA: Diagnosis not present

## 2023-03-27 ENCOUNTER — Other Ambulatory Visit: Payer: Self-pay | Admitting: Orthopedic Surgery

## 2023-03-30 ENCOUNTER — Inpatient Hospital Stay
Admission: RE | Admit: 2023-03-30 | Discharge: 2023-03-30 | Disposition: A | Discharge status: Home / Self Care | Source: Ambulatory Visit

## 2023-03-30 NOTE — Patient Instructions (Addendum)
 Your procedure is scheduled on: Tuesday 04/04/23 To find out your arrival time, please call [phone] between 1PM - 3PM on: Monday 04/03/23   Report to the Registration Desk on the 1st floor of the [facility]. Free Valet parking is available.  If your arrival time is 6:00 am, do not arrive before that time as the [facility] entrance doors do not open until 6:00 am.  REMEMBER: Instructions that are not followed completely may result in serious medical risk, up to and including death; or upon the discretion of your surgeon and anesthesiologist your surgery may need to be rescheduled.  Do not eat food after midnight the night before surgery.  No gum chewing or hard candies.  You may however, drink CLEAR liquids up to 2 hours before you are scheduled to arrive for your surgery. Do not drink anything within 2 hours of your scheduled arrival time.  Clear liquids include: - water  - apple juice without pulp - gatorade (not RED colors) - black coffee or tea (Do NOT add milk or creamers to the coffee or tea) Do NOT drink anything that is not on this list.  Type 1 and Type 2 diabetics should only drink water.  In addition, your doctor has ordered for you to drink the provided:  Ensure Pre-Surgery Clear Carbohydrate Drink  Drinking this carbohydrate drink up to two hours before surgery helps to reduce insulin resistance and improve patient outcomes. Please complete drinking 2 hours before scheduled arrival time.  One week prior to surgery: Stop Anti-inflammatories (NSAIDS) such as Advil, Aleve, Ibuprofen, Motrin, Naproxen, Naprosyn and Aspirin based products such as Excedrin, Goody's Powder, BC Powder. You may however, continue to take Tylenol if needed for pain up until the day of surgery.  Stop ANY OVER THE COUNTER supplements until after surgery.  Continue taking all prescribed medications with the exception of the following:  **Follow guidelines for insulin and diabetes  medications**  Follow recommendations from Cardiologist or PCP regarding stopping blood thinners.  TAKE ONLY THESE MEDICATIONS THE MORNING OF SURGERY WITH A SIP OF WATER:    Antacid (take one the night before and one on the morning of surgery - helps to prevent nausea after surgery.)  Use inhalers on the day of surgery and bring to the hospital.  Fleets enema or bowel prep as directed.  No Alcohol for 24 hours before or after surgery.  No Smoking including e-cigarettes for 24 hours before surgery.  No chewable tobacco products for at least 6 hours before surgery.  No nicotine patches on the day of surgery.  Do not use any "recreational" drugs for at least a week (preferably 2 weeks) before your surgery.  Please be advised that the combination of cocaine and anesthesia may have negative outcomes, up to and including death. If you test positive for cocaine, your surgery will be cancelled.  On the morning of surgery brush your teeth with toothpaste and water, you may rinse your mouth with mouthwash if you wish. Do not swallow any toothpaste or mouthwash.  Use CHG Soap or wipes as directed on instruction sheet.  Do not wear lotions, powders, or perfumes.   Do not shave body hair from the neck down 48 hours before surgery.  Wear comfortable clothing (specific to your surgery type) to the hospital.  Do not wear jewelry, make-up, hairpins, clips or nail polish.  For welded (permanent) jewelry: bracelets, anklets, waist bands, etc.  Please have this removed prior to surgery.  If it  is not removed, there is a chance that hospital personnel will need to cut it off on the day of surgery. Contact lenses, hearing aids and dentures may not be worn into surgery.  Bring your C-PAP to the hospital in case you may have to spend the night.   Do not bring valuables to the hospital. [facility] is not responsible for any missing/lost belongings or valuables.   Total Shoulder Arthroplasty:  use  Benzoyl Peroxide 5% Gel as directed on instruction sheet.  Notify your doctor if there is any change in your medical condition (cold, fever, infection).  If you are being discharged the day of surgery, you will not be allowed to drive home. You will need a responsible individual to drive you home and stay with you for 24 hours after surgery.   If you are taking public transportation, you will need to have a responsible individual with you.  If you are being admitted to the hospital overnight, leave your suitcase in the car. After surgery it may be brought to your room.  In case of increased patient census, it may be necessary for you, the patient, to continue your postoperative care in the Same Day Surgery department.  After surgery, you can help prevent lung complications by doing breathing exercises.  Take deep breaths and cough every 1-2 hours. Your doctor may order a device called an Incentive Spirometer to help you take deep breaths. When coughing or sneezing, hold a pillow firmly against your incision with both hands. This is called "splinting." Doing this helps protect your incision. It also decreases belly discomfort.  Surgery Visitation Policy:  Patients undergoing a surgery or procedure may have two family members or support persons with them as long as the person is not COVID-19 positive or experiencing its symptoms.   Inpatient Visitation:    Visiting hours are 7 a.m. to 8 p.m. Up to four visitors are allowed at one time in a patient room. The visitors may rotate out with other people during the day. One designated support person (adult) may remain overnight.  Due to an increase in RSV and influenza rates and associated hospitalizations, children ages 22 and under will not be able to visit patients in [facility]. Masks continue to be strongly recommended.  Please call the Pre-admissions Testing Dept. at [phone] if you have any questions about these  instructions.     Preparing for Surgery with CHLORHEXIDINE GLUCONATE (CHG) Soap  Chlorhexidine Gluconate (CHG) Soap  o An antiseptic cleaner that kills germs and bonds with the skin to continue killing germs even after washing  o Used for showering the night before surgery and morning of surgery  Before surgery, you can play an important role by reducing the number of germs on your skin.  CHG (Chlorhexidine gluconate) soap is an antiseptic cleanser which kills germs and bonds with the skin to continue killing germs even after washing.  Please do not use if you have an allergy to CHG or antibacterial soaps. If your skin becomes reddened/irritated stop using the CHG.  1. Shower the NIGHT BEFORE SURGERY and the MORNING OF SURGERY with CHG soap.  2. If you choose to wash your hair, wash your hair first as usual with your normal shampoo.  3. After shampooing, rinse your hair and body thoroughly to remove the shampoo.  4. Use CHG as you would any other liquid soap. You can apply CHG directly to the skin and wash gently with a scrungie or  a clean washcloth.  5. Apply the CHG soap to your body only from the neck down. Do not use on open wounds or open sores. Avoid contact with your eyes, ears, mouth, and genitals (private parts). Wash face and genitals (private parts) with your normal soap.  6. Wash thoroughly, paying special attention to the area where your surgery will be performed.  7. Thoroughly rinse your body with warm water.  8. Do not shower/wash with your normal soap after using and rinsing off the CHG soap.  9. Pat yourself dry with a clean towel.  10. Wear clean pajamas to bed the night before surgery.  12. Place clean sheets on your bed the night of your first shower and do not sleep with pets.  13. Shower again with the CHG soap on the day of surgery prior to arriving at the hospital.  14. Do not apply any deodorants/lotions/powders.  15. Please wear clean clothes to  the hospital.

## 2023-03-31 NOTE — Progress Notes (Signed)
 Pre-op interview attempted on 2 separate days, messages left on voicemail. No return call. Needs labs and EKG before surgery on Tuesday. Office made aware.

## 2023-04-02 NOTE — Anesthesia Preprocedure Evaluation (Signed)
 Anesthesia Evaluation  Patient identified by MRN, date of birth, ID band Patient awake    Reviewed: Allergy & Precautions, NPO status , Patient's Chart, lab work & pertinent test results  History of Anesthesia Complications Negative for: history of anesthetic complications  Airway Mallampati: III  TM Distance: >3 FB Neck ROM: full    Dental no notable dental hx.    Pulmonary neg pulmonary ROS, former smoker   Pulmonary exam normal        Cardiovascular negative cardio ROS Normal cardiovascular exam     Neuro/Psych  Neuromuscular disease  negative psych ROS   GI/Hepatic negative GI ROS, Neg liver ROS,,,  Endo/Other  negative endocrine ROS    Renal/GU negative Renal ROS  negative genitourinary   Musculoskeletal   Abdominal   Peds  Hematology negative hematology ROS (+)   Anesthesia Other Findings Past Medical History: No date: Arthritis     Comment:  fingers No date: BPH (benign prostatic hyperplasia) No date: Diverticulosis No date: Environmental and seasonal allergies No date: Hyperlipidemia No date: Pneumonia     Comment:  HISTORY  Past Surgical History: No date: BACK SURGERY 03/08/2017: CARPAL TUNNEL RELEASE; Left     Comment:  Procedure: CARPAL TUNNEL RELEASE;  Surgeon: Erin Sons, MD;  Location: [facility] ORS;  Service: Orthopedics;                Laterality: Left; 07/07/2015: CATARACT EXTRACTION W/PHACO; Left     Comment:  Procedure: CATARACT EXTRACTION PHACO AND INTRAOCULAR               LENS PLACEMENT (IOC);  Surgeon: Galen Manila, MD;                Location: [facility] OR[facility];  Service: Ophthalmology;  Laterality:              Left;  [place] 00:33.8 AP% 20.7 CDE 7.01 fluid pack lot               #[id number] 08/11/2015: CATARACT EXTRACTION W/PHACO; Right     Comment:  Procedure: CATARACT EXTRACTION PHACO AND INTRAOCULAR               LENS PLACEMENT (IOC);  Surgeon: Galen Manila, MD;                 Location: [facility];  Service: Ophthalmology;  Laterality:              Right;  [place] 49.0 AP% 17.8 CDE 8.73 Fluid Pack lot #               [id number] 01/03/2003: COLONOSCOPY W/ POLYPECTOMY     Comment:  12/04/2008, 01/01/2014 09/01/2017: COLONOSCOPY WITH PROPOFOL; N/A     Comment:  Procedure: COLONOSCOPY WITH PROPOFOL;  Surgeon: Scot Jun, MD;  Location: [facility];  Service:               Endoscopy;  Laterality: N/A; No date: EYE SURGERY 1975: HERNIA REPAIR; Right     Comment:  inguinal, left in 1980 2011: LAMINOTOMY / EXCISION DISK POSTERIOR CERVICAL SPINE     Comment:  metal in neck No date: RCR 1990: SHOULDER SURGERY; Right     Comment:  left done 1985 No date: SIGMOIDOSCOPY No date: TONSILLECTOMY  BMI    Body Mass Index: 30.11 kg/m  Reproductive/Obstetrics negative OB ROS                             Anesthesia Physical Anesthesia Plan  ASA: 2  Anesthesia Plan: General   Post-op Pain Management: Minimal or no pain anticipated and Regional block*   Induction: Intravenous  PONV Risk Score and Plan: 1 and Dexamethasone and Ondansetron  Airway Management Planned: Oral ETT  Additional Equipment:   Intra-op Plan:   Post-operative Plan: Extubation in OR  Informed Consent:      Dental Advisory Given  Plan Discussed with: Anesthesiologist, CRNA and Surgeon  Anesthesia Plan Comments:        Anesthesia Quick Evaluation

## 2023-04-04 ENCOUNTER — Ambulatory Visit

## 2023-04-04 ENCOUNTER — Encounter
Admission: RE | Disposition: A | Discharge status: Home / Self Care | Payer: Self-pay | Source: Home / Self Care | Attending: Orthopedic Surgery

## 2023-04-04 ENCOUNTER — Encounter: Payer: Self-pay | Admitting: Orthopedic Surgery

## 2023-04-04 ENCOUNTER — Other Ambulatory Visit: Payer: Self-pay

## 2023-04-04 ENCOUNTER — Ambulatory Visit: Payer: Self-pay | Admitting: Anesthesiology

## 2023-04-04 ENCOUNTER — Ambulatory Visit
Admission: RE | Admit: 2023-04-04 | Discharge: 2023-04-04 | Disposition: A | Discharge status: Home / Self Care | Attending: Orthopedic Surgery | Admitting: Orthopedic Surgery

## 2023-04-04 DIAGNOSIS — Z9889 Other specified postprocedural states: Secondary | ICD-10-CM | POA: Insufficient documentation

## 2023-04-04 DIAGNOSIS — G8918 Other acute postprocedural pain: Secondary | ICD-10-CM | POA: Diagnosis not present

## 2023-04-04 DIAGNOSIS — Z5333 Arthroscopic surgical procedure converted to open procedure: Secondary | ICD-10-CM | POA: Insufficient documentation

## 2023-04-04 DIAGNOSIS — X58XXXA Exposure to other specified factors, initial encounter: Secondary | ICD-10-CM | POA: Diagnosis not present

## 2023-04-04 DIAGNOSIS — S46012A Strain of muscle(s) and tendon(s) of the rotator cuff of left shoulder, initial encounter: Secondary | ICD-10-CM | POA: Insufficient documentation

## 2023-04-04 DIAGNOSIS — Z01818 Encounter for other preprocedural examination: Secondary | ICD-10-CM

## 2023-04-04 DIAGNOSIS — Z87891 Personal history of nicotine dependence: Secondary | ICD-10-CM | POA: Diagnosis not present

## 2023-04-04 DIAGNOSIS — S46112A Strain of muscle, fascia and tendon of long head of biceps, left arm, initial encounter: Secondary | ICD-10-CM | POA: Insufficient documentation

## 2023-04-04 DIAGNOSIS — M7522 Bicipital tendinitis, left shoulder: Secondary | ICD-10-CM | POA: Diagnosis not present

## 2023-04-04 DIAGNOSIS — S46002A Unspecified injury of muscle(s) and tendon(s) of the rotator cuff of left shoulder, initial encounter: Secondary | ICD-10-CM | POA: Diagnosis not present

## 2023-04-04 DIAGNOSIS — M75112 Incomplete rotator cuff tear or rupture of left shoulder, not specified as traumatic: Secondary | ICD-10-CM | POA: Diagnosis not present

## 2023-04-04 HISTORY — PX: SHOULDER ARTHROSCOPY WITH OPEN ROTATOR CUFF REPAIR: SHX6092

## 2023-04-04 LAB — CBC WITH DIFFERENTIAL/PLATELET
Abs Immature Granulocytes: 0.03 10*3/uL (ref 0.00–0.07)
Basophils Absolute: 0.1 10*3/uL (ref 0.0–0.1)
Basophils Relative: 1 %
Eosinophils Absolute: 0.2 10*3/uL (ref 0.0–0.5)
Eosinophils Relative: 3 %
HCT: 44 % (ref 39.0–52.0)
Hemoglobin: 14.9 g/dL (ref 13.0–17.0)
Immature Granulocytes: 0 %
Lymphocytes Relative: 21 %
Lymphs Abs: 1.6 10*3/uL (ref 0.7–4.0)
MCH: 29.3 pg (ref 26.0–34.0)
MCHC: 33.9 g/dL (ref 30.0–36.0)
MCV: 86.6 fL (ref 80.0–100.0)
Monocytes Absolute: 0.6 10*3/uL (ref 0.1–1.0)
Monocytes Relative: 8 %
Neutro Abs: 5.2 10*3/uL (ref 1.7–7.7)
Neutrophils Relative %: 67 %
Platelets: 216 10*3/uL (ref 150–400)
RBC: 5.08 MIL/uL (ref 4.22–5.81)
RDW: 13.9 % (ref 11.5–15.5)
WBC: 7.6 10*3/uL (ref 4.0–10.5)
nRBC: 0 % (ref 0.0–0.2)

## 2023-04-04 LAB — BASIC METABOLIC PANEL
Anion gap: 7 (ref 5–15)
BUN: 24 mg/dL — ABNORMAL HIGH (ref 8–23)
CO2: 23 mmol/L (ref 22–32)
Calcium: 9.2 mg/dL (ref 8.9–10.3)
Chloride: 112 mmol/L — ABNORMAL HIGH (ref 98–111)
Creatinine, Ser: 1.28 mg/dL — ABNORMAL HIGH (ref 0.61–1.24)
GFR, Estimated: 57 mL/min — ABNORMAL LOW (ref >60)
Glucose, Bld: 95 mg/dL (ref 70–99)
Potassium: 4.8 mmol/L (ref 3.5–5.1)
Sodium: 142 mmol/L (ref 135–145)

## 2023-04-04 SURGERY — ARTHROSCOPY, SHOULDER WITH REPAIR, ROTATOR CUFF, OPEN
Anesthesia: General | Site: Shoulder | Laterality: Left

## 2023-04-04 MED ORDER — ONDANSETRON HCL 4 MG/2ML IJ SOLN
INTRAMUSCULAR | Status: DC | PRN
  Administered 2023-04-04: 4 mg via INTRAVENOUS

## 2023-04-04 MED ORDER — FENTANYL CITRATE PF 50 MCG/ML IJ SOSY
PREFILLED_SYRINGE | INTRAMUSCULAR | Status: AC
  Filled 2023-04-04: qty 1

## 2023-04-04 MED ORDER — DEXAMETHASONE SODIUM PHOSPHATE 10 MG/ML IJ SOLN
INTRAMUSCULAR | Status: DC | PRN
  Administered 2023-04-04: 10 mg via INTRAVENOUS

## 2023-04-04 MED ORDER — ASPIRIN 325 MG PO TBEC: 325.0000 mg | DELAYED_RELEASE_TABLET | Freq: Every day | ORAL | 0 refills | Status: AC

## 2023-04-04 MED ORDER — SUGAMMADEX SODIUM 200 MG/2ML IV SOLN
INTRAVENOUS | Status: DC | PRN
Start: 2023-04-04 — End: 2023-04-04
  Administered 2023-04-04: 200 mg via INTRAVENOUS

## 2023-04-04 MED ORDER — PROPOFOL 10 MG/ML IV BOLUS
INTRAVENOUS | Status: AC
  Filled 2023-04-04: qty 20

## 2023-04-04 MED ORDER — VANCOMYCIN HCL 1000 MG IV SOLR
INTRAVENOUS | Status: AC
  Filled 2023-04-04: qty 20

## 2023-04-04 MED ORDER — ACETAMINOPHEN 500 MG PO TABS
1000.0000 mg | ORAL_TABLET | Freq: Once | ORAL | Status: AC
  Administered 2023-04-04: 1000 mg via ORAL

## 2023-04-04 MED ORDER — ACETAMINOPHEN 500 MG PO TABS
ORAL_TABLET | ORAL | Status: AC
  Filled 2023-04-04: qty 2

## 2023-04-04 MED ORDER — ONDANSETRON HCL 4 MG/2ML IJ SOLN
INTRAMUSCULAR | Status: AC
  Filled 2023-04-04: qty 2

## 2023-04-04 MED ORDER — GLYCOPYRROLATE 0.2 MG/ML IJ SOLN
INTRAMUSCULAR | Status: DC | PRN
  Administered 2023-04-04: .1 mg via INTRAVENOUS

## 2023-04-04 MED ORDER — CEFAZOLIN SODIUM-DEXTROSE 2-4 GM/100ML-% IV SOLN
INTRAVENOUS | Status: AC
  Filled 2023-04-04: qty 100

## 2023-04-04 MED ORDER — EPINEPHRINE PF 1 MG/ML IJ SOLN
INTRAMUSCULAR | Status: AC
  Filled 2023-04-04: qty 4

## 2023-04-04 MED ORDER — GLYCOPYRROLATE 0.2 MG/ML IJ SOLN
INTRAMUSCULAR | Status: AC
Start: 2023-04-04 — End: ?
  Filled 2023-04-04: qty 1

## 2023-04-04 MED ORDER — ORAL CARE MOUTH RINSE: 15.0000 mL | Freq: Once | OROMUCOSAL | Status: AC

## 2023-04-04 MED ORDER — FENTANYL CITRATE (PF) 100 MCG/2ML IJ SOLN: 25.0000 ug | INTRAMUSCULAR | Status: DC | PRN

## 2023-04-04 MED ORDER — FENTANYL CITRATE (PF) 100 MCG/2ML IJ SOLN
INTRAMUSCULAR | Status: AC
  Filled 2023-04-04: qty 2

## 2023-04-04 MED ORDER — LACTATED RINGERS IV SOLN: INTRAVENOUS | Status: DC

## 2023-04-04 MED ORDER — LACTATED RINGERS IV SOLN: INTRAVENOUS | Status: DC | PRN

## 2023-04-04 MED ORDER — MIDAZOLAM HCL 2 MG/2ML IJ SOLN
INTRAMUSCULAR | Status: DC | PRN
  Administered 2023-04-04: 2 mg via INTRAVENOUS

## 2023-04-04 MED ORDER — ROCURONIUM BROMIDE 10 MG/ML (PF) SYRINGE
PREFILLED_SYRINGE | INTRAVENOUS | Status: AC
  Filled 2023-04-04: qty 10

## 2023-04-04 MED ORDER — ONDANSETRON 4 MG PO TBDP: 4.0000 mg | ORAL_TABLET | Freq: Three times a day (TID) | ORAL | 0 refills | Status: DC | PRN

## 2023-04-04 MED ORDER — OXYCODONE HCL 5 MG PO TABS: 5.0000 mg | ORAL_TABLET | Freq: Once | ORAL | Status: DC | PRN

## 2023-04-04 MED ORDER — LIDOCAINE HCL (PF) 2 % IJ SOLN
INTRAMUSCULAR | Status: AC
  Filled 2023-04-04: qty 5

## 2023-04-04 MED ORDER — PROPOFOL 10 MG/ML IV BOLUS
INTRAVENOUS | Status: DC | PRN
  Administered 2023-04-04: 150 mg via INTRAVENOUS

## 2023-04-04 MED ORDER — CEFAZOLIN SODIUM-DEXTROSE 2-4 GM/100ML-% IV SOLN
2.0000 g | INTRAVENOUS | Status: AC
  Administered 2023-04-04: 2 g via INTRAVENOUS

## 2023-04-04 MED ORDER — ROCURONIUM BROMIDE 100 MG/10ML IV SOLN
INTRAVENOUS | Status: DC | PRN
  Administered 2023-04-04 (×2): 20 mg via INTRAVENOUS
  Administered 2023-04-04: 50 mg via INTRAVENOUS

## 2023-04-04 MED ORDER — FENTANYL CITRATE (PF) 100 MCG/2ML IJ SOLN
INTRAMUSCULAR | Status: DC | PRN
  Administered 2023-04-04 (×3): 50 ug via INTRAVENOUS

## 2023-04-04 MED ORDER — CHLORHEXIDINE GLUCONATE 0.12 % MT SOLN
15.0000 mL | Freq: Once | OROMUCOSAL | Status: AC
  Administered 2023-04-04: 15 mL via OROMUCOSAL

## 2023-04-04 MED ORDER — DROPERIDOL 2.5 MG/ML IJ SOLN: 0.6250 mg | Freq: Once | INTRAMUSCULAR | Status: DC | PRN

## 2023-04-04 MED ORDER — MIDAZOLAM HCL 2 MG/2ML IJ SOLN
INTRAMUSCULAR | Status: AC
  Filled 2023-04-04: qty 2

## 2023-04-04 MED ORDER — CHLORHEXIDINE GLUCONATE 0.12 % MT SOLN
OROMUCOSAL | Status: AC
  Filled 2023-04-04: qty 15

## 2023-04-04 MED ORDER — PHENYLEPHRINE HCL-NACL 20-0.9 MG/250ML-% IV SOLN
INTRAVENOUS | Status: AC
  Filled 2023-04-04: qty 250

## 2023-04-04 MED ORDER — ACETAMINOPHEN 500 MG PO TABS: 1000.0000 mg | ORAL_TABLET | Freq: Three times a day (TID) | ORAL | 2 refills | Status: AC

## 2023-04-04 MED ORDER — ACETAMINOPHEN 10 MG/ML IV SOLN: 1000.0000 mg | Freq: Once | INTRAVENOUS | Status: DC | PRN

## 2023-04-04 MED ORDER — OXYCODONE HCL 5 MG PO TABS: 5.0000 mg | ORAL_TABLET | ORAL | 0 refills | Status: DC | PRN

## 2023-04-04 MED ORDER — LACTATED RINGERS IR SOLN: Status: DC | PRN

## 2023-04-04 MED ORDER — BUPIVACAINE HCL (PF) 0.5 % IJ SOLN
INTRAMUSCULAR | Status: DC | PRN
  Administered 2023-04-04: 10 mL via PERINEURAL

## 2023-04-04 MED ORDER — BUPIVACAINE LIPOSOME 1.3 % IJ SUSP
INTRAMUSCULAR | Status: AC
Start: 2023-04-04 — End: ?
  Filled 2023-04-04: qty 10

## 2023-04-04 MED ORDER — DEXAMETHASONE SODIUM PHOSPHATE 10 MG/ML IJ SOLN
INTRAMUSCULAR | Status: AC
  Filled 2023-04-04: qty 1

## 2023-04-04 MED ORDER — BUPIVACAINE LIPOSOME 1.3 % IJ SUSP
INTRAMUSCULAR | Status: DC | PRN
  Administered 2023-04-04: 10 mL via PERINEURAL

## 2023-04-04 MED ORDER — PHENYLEPHRINE HCL-NACL 20-0.9 MG/250ML-% IV SOLN
INTRAVENOUS | Status: DC | PRN
  Administered 2023-04-04: 30 ug/min via INTRAVENOUS

## 2023-04-04 MED ORDER — OXYCODONE HCL 5 MG/5ML PO SOLN: 5.0000 mg | Freq: Once | ORAL | Status: DC | PRN

## 2023-04-04 MED ORDER — SUGAMMADEX SODIUM 200 MG/2ML IV SOLN
INTRAVENOUS | Status: AC
  Filled 2023-04-04: qty 2

## 2023-04-04 SURGICAL SUPPLY — 68 items
ADAPTER IRRIG TUBE 2 SPIKE SOL (ADAPTER) ×2 IMPLANT
ANCHOR 2.3 SP SGL 1.2 XBRAID (Anchor) IMPLANT
ANCHOR SWIVELOCK BIO 4.75X19.1 (Anchor) IMPLANT
BLADE SHAVER 4.5X7 STR FR (MISCELLANEOUS) ×1 IMPLANT
BLADE SW THK.38XMED LNG THN (BLADE) IMPLANT
BNDG ADH 2 X3.75 FABRIC TAN LF (GAUZE/BANDAGES/DRESSINGS) ×1 IMPLANT
BUR BR 5.5 WIDE MOUTH (BURR) IMPLANT
CANNULA PART THRD DISP 5.75X7 (CANNULA) IMPLANT
CANNULA PARTIAL THREAD 2X7 (CANNULA) ×1 IMPLANT
CANNULA TWIST IN 8.25X7CM (CANNULA) IMPLANT
CANNULA TWIST IN 8.25X9CM (CANNULA) IMPLANT
CHLORAPREP W/TINT 26 (MISCELLANEOUS) ×1 IMPLANT
COOLER POLAR GLACIER W/PUMP (MISCELLANEOUS) ×1 IMPLANT
COVER LIGHT HANDLE STERIS (MISCELLANEOUS) ×1 IMPLANT
DERMABOND ADVANCED .7 DNX12 (GAUZE/BANDAGES/DRESSINGS) IMPLANT
DRAPE INCISE IOBAN 66X45 STRL (DRAPES) ×1 IMPLANT
DRAPE U-SHAPE 47X51 STRL (DRAPES) ×2 IMPLANT
DRSG TEGADERM 4X4.75 (GAUZE/BANDAGES/DRESSINGS) IMPLANT
ELECT REM PT RETURN 9FT ADLT (ELECTROSURGICAL) ×1 IMPLANT
ELECTRODE REM PT RTRN 9FT ADLT (ELECTROSURGICAL) ×1 IMPLANT
GAUZE SPONGE 4X4 12PLY STRL (GAUZE/BANDAGES/DRESSINGS) ×1 IMPLANT
GAUZE XEROFORM 1X8 LF (GAUZE/BANDAGES/DRESSINGS) ×1 IMPLANT
GLOVE BIO SURGEON STRL SZ7.5 (GLOVE) ×2 IMPLANT
GLOVE BIOGEL PI IND STRL 8 (GLOVE) ×2 IMPLANT
GLOVE SURG SYN 8.0 (GLOVE) ×2 IMPLANT
GLOVE SURG SYN 8.0 PF PI (GLOVE) ×2 IMPLANT
GOWN STRL REUS W/ TWL LRG LVL3 (GOWN DISPOSABLE) ×2 IMPLANT
GOWN STRL REUS W/TWL XL LVL4 (GOWN DISPOSABLE) ×1 IMPLANT
IV LR IRRIG 3000ML ARTHROMATIC (IV SOLUTION) ×4 IMPLANT
KIT STABILIZATION SHOULDER (MISCELLANEOUS) ×1 IMPLANT
KIT TURNOVER KIT A (KITS) ×1 IMPLANT
MANIFOLD NEPTUNE II (INSTRUMENTS) ×2 IMPLANT
MASK FACE SPIDER DISP (MASK) ×1 IMPLANT
MAT ABSORB FLUID 56X50 GRAY (MISCELLANEOUS) ×2 IMPLANT
NDL HYPO 22X1.5 SAFETY MO (MISCELLANEOUS) ×1 IMPLANT
NDL MAYO 6 CRC TAPER PT (NEEDLE) IMPLANT
NDL SCORPION MULTI FIRE (NEEDLE) IMPLANT
NEEDLE HYPO 22X1.5 SAFETY MO (MISCELLANEOUS) IMPLANT
NEEDLE MAYO 6 CRC TAPER PT (NEEDLE) ×1 IMPLANT
NEEDLE SCORPION MULTI FIRE (NEEDLE) IMPLANT
PACK ARTHROSCOPY SHOULDER (MISCELLANEOUS) ×1 IMPLANT
PAD ABD DERMACEA PRESS 5X9 (GAUZE/BANDAGES/DRESSINGS) ×1 IMPLANT
PAD ARMBOARD POSITIONER FOAM (MISCELLANEOUS) ×2 IMPLANT
PAD WRAPON POLAR SHDR XLG (MISCELLANEOUS) ×1 IMPLANT
PASSER SUT FIRSTPASS SELF (INSTRUMENTS) ×1 IMPLANT
SHAVER BLADE BONE CUTTER 5.5 (BLADE) IMPLANT
SLEEVE REMOTE CONTROL 5X12 (DRAPES) IMPLANT
SLING ULTRA II M (MISCELLANEOUS) ×1 IMPLANT
SPONGE T-LAP 18X18 ~~LOC~~+RFID (SPONGE) ×1 IMPLANT
STRAP SAFETY 5IN WIDE (MISCELLANEOUS) ×1 IMPLANT
SUT ETHILON 3-0 (SUTURE) ×1 IMPLANT
SUT LASSO 90 DEG SD STR (SUTURE) IMPLANT
SUT MNCRL 4-0 27 PS-2 XMFL (SUTURE) ×1 IMPLANT
SUT PROLENE 0 CT 2 (SUTURE) IMPLANT
SUT TICRON 2-0 30IN 311381 (SUTURE) IMPLANT
SUT VIC AB 0 CT1 36 (SUTURE) IMPLANT
SUT VIC AB 2-0 CT2 27 (SUTURE) IMPLANT
SUTURE MNCRL 4-0 27XMF (SUTURE) IMPLANT
SUTURE TAPE 1.3 40 TPR END (SUTURE) IMPLANT
SUTURETAPE 1.3 40 TPR END (SUTURE) ×1 IMPLANT
SYR 10ML LL (SYRINGE) IMPLANT
TAPE MICROFOAM 4IN (TAPE) ×1 IMPLANT
TRAP FLUID SMOKE EVACUATOR (MISCELLANEOUS) ×1 IMPLANT
TUBE SET DOUBLEFLO INFLOW (TUBING) ×1 IMPLANT
TUBE SET DOUBLEFLO OUTFLOW (TUBING) ×1 IMPLANT
WAND WEREWOLF FLOW 90D (MISCELLANEOUS) ×1 IMPLANT
WATER STERILE IRR 500ML POUR (IV SOLUTION) ×1 IMPLANT
WRAPON POLAR PAD SHDR XLG (MISCELLANEOUS) ×1 IMPLANT

## 2023-04-04 NOTE — Anesthesia Postprocedure Evaluation (Signed)
 Anesthesia Post Note  Patient: George Davis  Procedure(s) Performed: ARTHROSCOPY, SHOULDER WITH REPAIR, ROTATOR CUFF, OPEN (Left: Shoulder)  Patient location during evaluation: PACU Anesthesia Type: General Level of consciousness: awake and alert, oriented and patient cooperative Pain management: pain level controlled Vital Signs Assessment: post-procedure vital signs reviewed and stable Respiratory status: spontaneous breathing, nonlabored ventilation and respiratory function stable Cardiovascular status: blood pressure returned to baseline and stable Postop Assessment: adequate PO intake Anesthetic complications: no   No notable events documented.   Last Vitals:  Vitals:   04/04/23 1545 04/04/23 1553  BP:  115/84  Pulse:  81  Resp:  16  Temp: (!) 36.1 C (!) 36.4 C  SpO2:  97%    Last Pain:  Vitals:   04/04/23 1553  TempSrc: Temporal  PainSc: 0-No pain                 Reed Breech

## 2023-04-04 NOTE — Op Note (Signed)
 SURGERY DATE: 04/04/2023  PRE-OP DIAGNOSIS:  1. Left rotator cuff tear 2. Left biceps tendinopathy  POST-OP DIAGNOSIS: 1. Left rotator cuff tear (supraspinatus) 2. Left proximal biceps rupture  PROCEDURES:  1. Left mini-open rotator cuff repair 2. Left arthroscopic extensive debridement of shoulder (glenohumeral and subacromial spaces)  SURGEON: Rosealee Albee, MD  ASSISTANT: Sonny Dandy, PA   ANESTHESIA: Gen with Exparel interscalene block  ESTIMATED BLOOD LOSS: 25cc  DRAINS:  none  TOTAL IV FLUIDS: per anesthesia   SPECIMENS: none  IMPLANTS:  - Arthrex 4.80mm SwiveLock x2 - Iconix SPEED double loaded with 1.2 and 2.53mm tape x 2   OPERATIVE FINDINGS:  Examination under anesthesia: A careful examination under anesthesia was performed.  Passive range of motion was: FF: 150; ER at side: 45; ER in abduction: 90; IR in abduction: 45.  Anterior load shift: NT.  Posterior load shift: NT.  Sulcus in neutral: NT.  Sulcus in ER: NT.    Intra-operative findings: A thorough arthroscopic examination of the shoulder was performed.  The findings are: 1. Biceps tendon: not visualized intraarticularly 2. Superior labrum: injected with surrounding synovitis 3. Posterior labrum and capsule: degenerative fraying of labrum 4. Inferior capsule and inferior recess: normal 5. Glenoid cartilage surface: Scattered grade 2 degenerative changes with mild surface fibrillation superiorly 6. Supraspinatus attachment: small full-thickness tear of posterior supraspinatus and high-grade bursal sided tear of anterior supraspinatus 7. Posterior rotator cuff attachment: normal 8. Humeral head articular cartilage: large area of Grade 3 degenerative changes 9. Rotator interval: significant synovitis 10: Subscapularis tendon: attachment intact 11. Anterior labrum: degenerative 12. IGHL: normal  OPERATIVE REPORT:   Indications for procedure: LESS Davis is a 78 y.o. male with acute L shoulder pain  after traumatic injury. Clinical exam and MRI were suggestive of rotator cuff tear. He has had prior L shoulder surgery ~30 years ago. After discussion of risks, benefits, and alternatives to surgery, the patient elected to proceed.   Procedure in detail:  I identified George Davis in the pre-operative holding area.  I marked the operative shoulder with my initials. I reviewed the risks and benefits of the proposed surgical intervention, and the patient (and/or patient's guardian) wished to proceed.  Anesthesia was then performed with an Exparel interscalene block.  The patient was transferred to the operative suite and placed in the beach chair position.    SCDs were placed on the lower extremities. Appropriate IV antibiotics were administered prior to incision. The operative upper extremity was then prepped and draped in standard fashion. A time out was performed confirming the correct extremity, correct patient, and correct procedure.   I then created a standard posterior portal with an 11 blade. The glenohumeral joint was easily entered with a blunt trochar and the arthroscope introduced. The findings of diagnostic arthroscopy are described above. I debrided degenerative tissue including the synovitic tissue about the rotator interval and anterior and superior labrum. I also debrided the cartilaginous edges of the humeral head and glenoid such that they were stable. I then coagulated the inflamed synovium to obtain hemostasis and reduce the risk of post-operative swelling using an Arthrocare radiofrequency device.   Next, the arthroscope was then introduced into the subacromial space. A direct lateral portal was created with an 11-blade after spinal needle localization. An extensive subacromial bursectomy and debridement was performed using a combination of the shaver and Arthrocare wand.   I then turned my attention to the arthroscopic distal clavicle excision. I identified the acromioclavicular  joint. Surrounding bursal tissue was debrided and the edges of the joint were identified. There was already ~69mm space at the level of [facility] joint so further resection was not performed.  Next, a posterolateral accessory viewing portal was made. Initial tear pattern appeared to be a small longitudinal defect of the posterior supraspinatus and a high-grade bursal sided tear of the anterior supraspinatus. These tears were not connected.   Given tear pattern, decision was made to perform repair in a mini-open fashion. BA longitudinal incision from the anterolateral acromion ~6cm in length was made overlying the raphe between the anterior and middle heads of the deltoid. The raphe was identified and it was incised. The subacromial space was identified. Any remaining bursa was excised. Both rotator cuff tears wear identified. Degenerative tissue about the posterior tear was excised with a 15 blade. The anterior tear was completed by easily passing a switching stick through the remnant fibers in the region of partial thickness tearing.  Footprints of both tears were cleared of soft tissue and bleeding beds were created using a rongeur.  One Iconix SPEED anchor was placed just lateral to the articular margin for the posterior tear.  The rotator cuff was mobilized using key elevators both superior and inferior to the tear. The rotator cuff was able to be reduced to its footprint. All 4 strands of suture from the medial row anchor were passed through the rotator cuff in an appropriate fashion. A SwiveLock anchor was placed for the lateral row anchor with all 4 sutures passed through the anchor.  Prior to final fixation, a suture tape was placed in a side-to-side fashion across this tear.  A knot was tied, further reducing this tear.  The lateral row was fixated while holding the medial row sutures under appropriate tension.  This allowed for reapproximation of the rotator cuff over its footprint.  This construct was  stable with external and internal rotation and allowed for appropriate reapproximation of the rotator cuff to its natural footprint..  This process was then repeated for the anterior supraspinatus tear. One SPEED anchor was used for medial row fixation, and once SwiveLock anchor was used for lateral fixation.  The wound was thoroughly irrigated.  The deltoid split was closed with 0 Vicryl.  The subdermal layer was closed with 2-0 Vicryl.  The skin was closed with 4-0 Monocryl and Dermabond. The portals were closed with 3-0 Nylon. Xeroform was applied to the incisions. A sterile dressing was applied, followed by a Polar Care sleeve and a SlingShot shoulder immobilizer/sling. The patient was awakened from anesthesia without difficulty and was transferred to the PACU in stable condition.     COMPLICATIONS: none  DISPOSITION: plan for discharge home after recovery in PACU   POSTOPERATIVE PLAN: Remain in sling (except hygiene and elbow/wrist/hand RoM exercises as instructed by PT) x 6 weeks and NWB for this time. PT to begin 3-4 days after surgery. Large rotator cuff repair protocol. ASA 325mg  daily x 2 weeks for DVT ppx.

## 2023-04-04 NOTE — H&P (Signed)
 Paper H&P to be scanned into permanent record. H&P reviewed. No significant changes noted.

## 2023-04-04 NOTE — Anesthesia Procedure Notes (Signed)
 Procedure Name: Intubation Date/Time: 04/04/2023 1:09 PM  Performed by: Lily Lovings, CRNAPre-anesthesia Checklist: Patient identified, Patient being monitored, Emergency Drugs available and Suction available Patient Re-evaluated:Patient Re-evaluated prior to induction Oxygen Delivery Method: Circle system utilized Preoxygenation: Pre-oxygenation with 100% oxygen Induction Type: IV induction Ventilation: Mask ventilation without difficulty Laryngoscope Size: 3 and McGrath Grade View: Grade I Tube type: Oral Tube size: 7.0 mm Number of attempts: 1 Airway Equipment and Method: Stylet Placement Confirmation: ETT inserted through vocal cords under direct vision, positive ETCO2 and breath sounds checked- equal and bilateral Secured at: 23 cm Tube secured with: Tape Dental Injury: Teeth and Oropharynx as per pre-operative assessment  Comments: Soft bite block

## 2023-04-04 NOTE — Transfer of Care (Signed)
 Immediate Anesthesia Transfer of Care Note  Patient: George Davis  Procedure(s) Performed: ARTHROSCOPY, SHOULDER WITH REPAIR, ROTATOR CUFF, OPEN (Left: Shoulder)  Patient Location: PACU  Anesthesia Type:General and Regional  Level of Consciousness: patient cooperative and responds to stimulation  Airway & Oxygen Therapy: Patient Spontanous Breathing and Patient connected to nasal cannula oxygen  Post-op Assessment: Report given to RN and Post -op Vital signs reviewed and stable  Post vital signs: stable  Last Vitals:  Vitals Value Taken Time  BP 121/52 04/04/23 1508  Temp    Pulse 69 04/04/23 1510  Resp 21 04/04/23 1510  SpO2 98 % 04/04/23 1510  Vitals shown include unfiled device data.  Last Pain:  Vitals:   04/04/23 1245  TempSrc:   PainSc: 0-No pain         Complications: No notable events documented.

## 2023-04-04 NOTE — Discharge Instructions (Addendum)
 Post-Op Instructions - Rotator Cuff Repair  1. Bracing: You will wear a shoulder immobilizer or sling for 6 weeks.   2. Driving: No driving for 3 weeks post-op. When driving, do not wear the immobilizer. Ideally, we recommend no driving for 6 weeks while sling is in place as one arm will be immobilized.   3. Activity: No active lifting for 2 months. Wrist, hand, and elbow motion only. Avoid lifting the upper arm away from the body except for hygiene. You are permitted to bend and straighten the elbow passively only (no active elbow motion). You may use your hand and wrist for typing, writing, and managing utensils (cutting food). Do not lift more than a coffee cup for 8 weeks.  When sleeping or resting, inclined positions (recliner chair or wedge pillow) and a pillow under the forearm for support may provide better comfort for up to 4 weeks.  Avoid long distance travel for 4 weeks.  Return to normal activities after rotator cuff repair repair normally takes 6 months on average. If rehab goes very well, may be able to do most activities at 4 months, except overhead or contact sports.  4. Physical Therapy: Begins 3-4 days after surgery, and proceed 1 time per week for the first 6 weeks, then 1-2 times per week from weeks 6-20 post-op.  5. Medications:  - You will be provided a prescription for narcotic pain medicine. After surgery, take 1-2 narcotic tablets every 4 hours if needed for severe pain.  - A prescription for anti-nausea medication will be provided in case the narcotic medicine causes nausea - take 1 tablet every 6 hours only if nauseated.   - Take tylenol 1000 mg (2 Extra Strength tablets or 3 regular strength) every 8 hours for pain.  May decrease or stop tylenol 5 days after surgery if you are having minimal pain. - Take ASA 325mg /day x 2 weeks to help prevent DVTs/PEs (blood clots).  - DO NOT take ANY nonsteroidal anti-inflammatory pain medications (Advil, Motrin, Ibuprofen, Aleve,  Naproxen, or Naprosyn). These medicines can inhibit healing of your shoulder repair.    If you are taking prescription medication for anxiety, depression, insomnia, muscle spasm, chronic pain, or for attention deficit disorder, you are advised that you are at a higher risk of adverse effects with use of narcotics post-op, including narcotic addiction/dependence, depressed breathing, death. If you use non-prescribed substances: alcohol, marijuana, cocaine, heroin, methamphetamines, etc., you are at a higher risk of adverse effects with use of narcotics post-op, including narcotic addiction/dependence, depressed breathing, death. You are advised that taking > 50 morphine milligram equivalents (MME) of narcotic pain medication per day results in twice the risk of overdose or death. For your prescription provided: oxycodone 5 mg - taking more than 6 tablets per day would result in > 50 morphine milligram equivalents (MME) of narcotic pain medication. Be advised that we will prescribe narcotics short-term, for acute post-operative pain only - 3 weeks for major operations such as shoulder repair/reconstruction surgeries.     6. Post-Op Appointment:  Your first post-op appointment will be 10-14 days post-op.  7. Work or School: For most, but not all procedures, we advise staying out of work or school for at least 1 to 2 weeks in order to recover from the stress of surgery and to allow time for healing.   If you need a work or school note this can be provided.   8. Smoking: If you are a smoker, you need to refrain from  smoking in the postoperative period. The nicotine in cigarettes will inhibit healing of your shoulder repair and decrease the chance of successful repair. Similarly, nicotine containing products (gum, patches) should be avoided.   Post-operative Brace: Apply and remove the brace you received as you were instructed to at the time of fitting and as described in detail as the brace's  instructions for use indicate.  Wear the brace for the period of time prescribed by your physician.  The brace can be cleaned with soap and water and allowed to air dry only.  Should the brace result in increased pain, decreased feeling (numbness/tingling), increased swelling or an overall worsening of your medical condition, please contact your doctor immediately.  If an emergency situation occurs as a result of wearing the brace after normal business hours, please dial 911 and seek immediate medical attention.  Let your doctor know if you have any further questions about the brace issued to you. Refer to the shoulder sling instructions for use if you have any questions regarding the correct fit of your shoulder sling.  [facility] Customer Care for Troubleshooting: [phone]  Video that illustrates how to properly use a shoulder sling: "Instructions for Proper Use of an Orthopaedic Sling" http://bass.com/    POLAR CARE INFORMATION  MassAdvertisement.it  How to use [facility] Polar Care [facility]?  YouTube   ShippingScam.co.uk  OPERATING INSTRUCTIONS  Start the product With dry hands, connect the transformer to the electrical connection located on the top of the cooler. Next, plug the transformer into an appropriate electrical outlet. The unit will automatically start running at this point.  To stop the pump, disconnect electrical power.  Unplug to stop the product when not in use. Unplugging the Polar Care unit turns it off. Always unplug immediately after use. Never leave it plugged in while unattended. Remove pad.    FIRST ADD WATER TO FILL LINE, THEN ICE---Replace ice when existing ice is almost melted  1 Discuss Treatment with your Licensed Health Care Practitioner and Use Only as Prescribed 2 Apply Insulation Barrier & Cold Therapy Pad 3 Check for Moisture 4 Inspect Skin Regularly  Tips and Trouble Shooting Usage Tips 1. Use  cubed or chunked ice for optimal performance. 2. It is recommended to drain the Pad between uses. To drain the pad, hold the Pad upright with the hose pointed toward the ground. Depress the black plunger and allow water to drain out. 3. You may disconnect the Pad from the unit without removing the pad from the affected area by depressing the silver tabs on the hose coupling and gently pulling the hoses apart. The Pad and unit will seal itself and will not leak. Note: Some dripping during release is normal. 4. DO NOT RUN PUMP WITHOUT WATER! The pump in this unit is designed to run with water. Running the unit without water will cause permanent damage to the pump. 5. Unplug unit before removing lid.  TROUBLESHOOTING GUIDE Pump not running, Water not flowing to the pad, Pad is not getting cold 1. Make sure the transformer is plugged into the wall outlet. 2. Confirm that the ice and water are filled to the indicated levels. 3. Make sure there are no kinks in the pad. 4. Gently pull on the blue tube to make sure the tube/pad junction is straight. 5. Remove the pad from the treatment site and ll it while the pad is lying at; then reapply. 6. Confirm that the pad couplings are securely attached to the  unit. Listen for the double clicks (Figure 1) to confirm the pad couplings are securely attached.  Leaks    Note: Some condensation on the lines, controller, and pads is unavoidable, especially in warmer climates. 1. If using a [facility] Polar Care Cold Therapy unit with a detachable Cold Therapy Pad, and a leak exists (other than condensation on the lines) disconnect the pad couplings. Make sure the silver tabs on the couplings are depressed before reconnecting the pad to the pump hose; then confirm both sides of the coupling are properly clicked in. 2. If the coupling continues to leak or a leak is detected in the pad itself, stop using it and call [employer] Customer Care at [phone].  Cleaning After use,  empty and dry the unit with a soft cloth. Warm water and mild detergent may be used occasionally to clean the pump and tubes.  WARNING: The Polar Care Cube can be cold enough to cause serious injury, including full skin necrosis. Follow these Operating Instructions, and carefully read the Product Insert (see pouch on side of unit) and the Cold Therapy Pad Fitting Instructions (provided with each Cold Therapy Pad) prior to use.       Interscalene Nerve Block (ISNB) Discharge Instructions    For your surgery you have received an Interscalene Nerve Block. Nerve Blocks affect many types of nerves, including nerves that control movement, pain and normal sensation.  You may experience feelings such as numbness, tingling, heaviness, weakness or the inability to move your arm or the feeling or sensation that your arm has "fallen asleep". A nerve block can last for 2 - 36 hours or more depending on the medication used.  Usually the weakness wears off first.  The tingling and heaviness usually wear off next.  Finally you may start to notice pain.  Keep in mind that this may occur in any order.  once a nerve block starts to wear off it is usually completely gone within 60 minutes. ISNB may cause mild shortness of breath, a hoarse voice, blurry vision, unequal pupils, or drooping of the face on the same side as the nerve block.  These symptoms will usually go away within 12 hours.  Very rarely the procedure itself can cause mild seizures. If needed, your surgeon will give you a prescription for pain medication.  It will take about 60 minutes for the oral pain medication to become fully effective.  So, it is recommended that you start taking this medication before the nerve block first begins to wear off, or when you first begin to feel discomfort. Keep in mind that nerve blocks often wear off in the middle of the night.   If you are going to bed and the block has not started to wear off or you have not started to  have any discomfort, consider setting an alarm for 2 to 3 hours, so you can assess your block.  If you notice the block is wearing off or you are starting to have discomfort, you can take your pain medication. Take your pain medication only as prescribed.  Pain medication can cause sedation and decrease your breathing if you take more than you need for the level of pain that you have. Nausea is a common side effect of many pain medications.  You may want to eat something before taking your pain medicine to prevent nausea. After an Interscalene nerve block, you cannot feel pain, pressure or extremes in temperature in the effected arm.  Because your  arm is numb it is at an increased risk for injury.  To decrease the possibility of injury, please practice the following:  While you are awake change the position of your arm frequently to prevent too much pressure on any one area for prolonged periods of time.  If you have a cast or tight dressing, check the color or your fingers every couple of hours.  Call your surgeon with the appearance of any discoloration (white or blue). If you are given a sling to wear before you go home, please wear it  at all times until the block has completely worn off.  Do not get up at night without your sling. If you experience any problems or concerns, please contact your surgeon's office. If you experience severe or prolonged shortness of breath go to the nearest emergency department.  SHOULDER SLING IMMOBILIZER   VIDEO Slingshot 2 Shoulder Brace Application - YouTube ---https://www.porter.info/  INSTRUCTIONS While supporting the injured arm, slide the forearm into the sling. Wrap the adjustable shoulder strap around the neck and shoulders and attach the strap end to the sling using  the "alligator strap tab."  Adjust the shoulder strap to the required length. Position the shoulder pad behind the neck. To secure the shoulder pad location (optional),  pull the shoulder strap away from the shoulder pad, unfold the hook material on the top of the pad, then press the shoulder strap back onto the hook material to secure the pad in place. Attach the closure strap across the open top of the sling. Position the strap so that it holds the arm securely in the sling. Next, attach the thumb strap to the open end of the sling between the thumb and fingers. After sling has been fit, it may be easily removed and reapplied using the quick release buckle on shoulder strap. If a neutral pillow or 15 abduction pillow is included, place the pillow at the waistline. Attach the sling to the pillow, lining up hook material on the pillow with the loop on sling. Adjust the waist strap to fit.  If waist strap is too long, cut it to fit. Use the small piece of double sided hook material (located on top of the pillow) to secure the strap end. Place the double sided hook material on the inside of the cut strap end and secure it to the waist strap.     If no pillow is included, attach the waist strap to the sling and adjust to fit.    Washing Instructions: Straps and sling must be removed and cleaned regularly depending on your activity level and perspiration. Hand wash straps and sling in cold water with mild detergent, rinse, air dry

## 2023-04-04 NOTE — Anesthesia Procedure Notes (Signed)
 Anesthesia Regional Block: Interscalene brachial plexus block   Pre-Anesthetic Checklist: , timeout performed,  Correct Patient, Correct Site, Correct Laterality,  Correct Procedure, Correct Position, site marked,  Risks and benefits discussed,  Surgical consent,  Pre-op evaluation,  At surgeon's request and post-op pain management  Laterality: Left  Prep: chloraprep       Needles:  Injection technique: Single-shot  Needle Type: Stimiplex     Needle Length: 9cm  Needle Gauge: 22     Additional Needles:   Procedures:,,,, ultrasound used (permanent image in chart),,    Narrative:  Start time: 04/04/2023 12:50 PM End time: 04/04/2023 12:53 PM Injection made incrementally with aspirations every 5 mL.  Performed by: Personally  Anesthesiologist: Foye Deer, MD  Additional Notes: Patient consented for risk and benefits of nerve block including but not limited to nerve damage, failed block, bleeding and infection.  Patient voiced understanding.  Functioning IV was confirmed and monitors were applied.  Timeout done prior to procedure and prior to any sedation being given to the patient.  Patient confirmed procedure site prior to any sedation given to the patient. Sterile prep,hand hygiene and sterile gloves were used.  Minimal sedation used for procedure.  No paresthesia endorsed by patient during the procedure.  Negative aspiration and negative test dose prior to incremental administration of local anesthetic. The patient tolerated the procedure well with no immediate complications.

## 2023-04-05 ENCOUNTER — Encounter: Payer: Self-pay | Admitting: Orthopedic Surgery

## 2023-04-10 DIAGNOSIS — M25512 Pain in left shoulder: Secondary | ICD-10-CM | POA: Diagnosis not present

## 2023-04-10 DIAGNOSIS — S46002A Unspecified injury of muscle(s) and tendon(s) of the rotator cuff of left shoulder, initial encounter: Secondary | ICD-10-CM | POA: Diagnosis not present

## 2023-04-10 DIAGNOSIS — M6281 Muscle weakness (generalized): Secondary | ICD-10-CM | POA: Diagnosis not present

## 2023-04-10 DIAGNOSIS — M25612 Stiffness of left shoulder, not elsewhere classified: Secondary | ICD-10-CM | POA: Diagnosis not present

## 2023-04-20 DIAGNOSIS — M25612 Stiffness of left shoulder, not elsewhere classified: Secondary | ICD-10-CM | POA: Diagnosis not present

## 2023-04-20 DIAGNOSIS — M25512 Pain in left shoulder: Secondary | ICD-10-CM | POA: Diagnosis not present

## 2023-04-20 DIAGNOSIS — S46002A Unspecified injury of muscle(s) and tendon(s) of the rotator cuff of left shoulder, initial encounter: Secondary | ICD-10-CM | POA: Diagnosis not present

## 2023-04-21 ENCOUNTER — Encounter: Payer: Self-pay | Admitting: Orthopedic Surgery

## 2023-04-28 DIAGNOSIS — M25612 Stiffness of left shoulder, not elsewhere classified: Secondary | ICD-10-CM | POA: Diagnosis not present

## 2023-04-28 DIAGNOSIS — S46002A Unspecified injury of muscle(s) and tendon(s) of the rotator cuff of left shoulder, initial encounter: Secondary | ICD-10-CM | POA: Diagnosis not present

## 2023-04-28 DIAGNOSIS — M25512 Pain in left shoulder: Secondary | ICD-10-CM | POA: Diagnosis not present

## 2023-05-01 DIAGNOSIS — S46002A Unspecified injury of muscle(s) and tendon(s) of the rotator cuff of left shoulder, initial encounter: Secondary | ICD-10-CM | POA: Diagnosis not present

## 2023-05-04 DIAGNOSIS — H524 Presbyopia: Secondary | ICD-10-CM | POA: Diagnosis not present

## 2023-05-10 DIAGNOSIS — M6281 Muscle weakness (generalized): Secondary | ICD-10-CM | POA: Diagnosis not present

## 2023-05-10 DIAGNOSIS — M25612 Stiffness of left shoulder, not elsewhere classified: Secondary | ICD-10-CM | POA: Diagnosis not present

## 2023-05-10 DIAGNOSIS — M25512 Pain in left shoulder: Secondary | ICD-10-CM | POA: Diagnosis not present

## 2023-05-10 DIAGNOSIS — S46002A Unspecified injury of muscle(s) and tendon(s) of the rotator cuff of left shoulder, initial encounter: Secondary | ICD-10-CM | POA: Diagnosis not present

## 2023-05-17 DIAGNOSIS — S46002A Unspecified injury of muscle(s) and tendon(s) of the rotator cuff of left shoulder, initial encounter: Secondary | ICD-10-CM | POA: Diagnosis not present

## 2023-05-17 DIAGNOSIS — M25512 Pain in left shoulder: Secondary | ICD-10-CM | POA: Diagnosis not present

## 2023-05-17 DIAGNOSIS — M25612 Stiffness of left shoulder, not elsewhere classified: Secondary | ICD-10-CM | POA: Diagnosis not present

## 2023-05-25 DIAGNOSIS — M25512 Pain in left shoulder: Secondary | ICD-10-CM | POA: Diagnosis not present

## 2023-05-25 DIAGNOSIS — M25612 Stiffness of left shoulder, not elsewhere classified: Secondary | ICD-10-CM | POA: Diagnosis not present

## 2023-05-25 DIAGNOSIS — S46002A Unspecified injury of muscle(s) and tendon(s) of the rotator cuff of left shoulder, initial encounter: Secondary | ICD-10-CM | POA: Diagnosis not present

## 2023-05-31 DIAGNOSIS — S46002A Unspecified injury of muscle(s) and tendon(s) of the rotator cuff of left shoulder, initial encounter: Secondary | ICD-10-CM | POA: Diagnosis not present

## 2023-05-31 DIAGNOSIS — M6281 Muscle weakness (generalized): Secondary | ICD-10-CM | POA: Diagnosis not present

## 2023-05-31 DIAGNOSIS — M25512 Pain in left shoulder: Secondary | ICD-10-CM | POA: Diagnosis not present

## 2023-05-31 DIAGNOSIS — M25612 Stiffness of left shoulder, not elsewhere classified: Secondary | ICD-10-CM | POA: Diagnosis not present

## 2023-06-05 DIAGNOSIS — M6281 Muscle weakness (generalized): Secondary | ICD-10-CM | POA: Diagnosis not present

## 2023-06-05 DIAGNOSIS — M25612 Stiffness of left shoulder, not elsewhere classified: Secondary | ICD-10-CM | POA: Diagnosis not present

## 2023-06-05 DIAGNOSIS — S46002A Unspecified injury of muscle(s) and tendon(s) of the rotator cuff of left shoulder, initial encounter: Secondary | ICD-10-CM | POA: Diagnosis not present

## 2023-06-05 DIAGNOSIS — M25512 Pain in left shoulder: Secondary | ICD-10-CM | POA: Diagnosis not present

## 2023-06-08 DIAGNOSIS — M25612 Stiffness of left shoulder, not elsewhere classified: Secondary | ICD-10-CM | POA: Diagnosis not present

## 2023-06-08 DIAGNOSIS — S46002A Unspecified injury of muscle(s) and tendon(s) of the rotator cuff of left shoulder, initial encounter: Secondary | ICD-10-CM | POA: Diagnosis not present

## 2023-06-08 DIAGNOSIS — M25512 Pain in left shoulder: Secondary | ICD-10-CM | POA: Diagnosis not present

## 2023-06-13 DIAGNOSIS — M6281 Muscle weakness (generalized): Secondary | ICD-10-CM | POA: Diagnosis not present

## 2023-06-13 DIAGNOSIS — S46002A Unspecified injury of muscle(s) and tendon(s) of the rotator cuff of left shoulder, initial encounter: Secondary | ICD-10-CM | POA: Diagnosis not present

## 2023-06-13 DIAGNOSIS — M25512 Pain in left shoulder: Secondary | ICD-10-CM | POA: Diagnosis not present

## 2023-06-13 DIAGNOSIS — M25612 Stiffness of left shoulder, not elsewhere classified: Secondary | ICD-10-CM | POA: Diagnosis not present

## 2023-06-15 DIAGNOSIS — M25512 Pain in left shoulder: Secondary | ICD-10-CM | POA: Diagnosis not present

## 2023-06-15 DIAGNOSIS — S46002A Unspecified injury of muscle(s) and tendon(s) of the rotator cuff of left shoulder, initial encounter: Secondary | ICD-10-CM | POA: Diagnosis not present

## 2023-06-15 DIAGNOSIS — M25612 Stiffness of left shoulder, not elsewhere classified: Secondary | ICD-10-CM | POA: Diagnosis not present

## 2023-06-15 DIAGNOSIS — M6281 Muscle weakness (generalized): Secondary | ICD-10-CM | POA: Diagnosis not present

## 2023-06-20 DIAGNOSIS — S46002A Unspecified injury of muscle(s) and tendon(s) of the rotator cuff of left shoulder, initial encounter: Secondary | ICD-10-CM | POA: Diagnosis not present

## 2023-06-20 DIAGNOSIS — M6281 Muscle weakness (generalized): Secondary | ICD-10-CM | POA: Diagnosis not present

## 2023-06-20 DIAGNOSIS — M25512 Pain in left shoulder: Secondary | ICD-10-CM | POA: Diagnosis not present

## 2023-06-20 DIAGNOSIS — M25612 Stiffness of left shoulder, not elsewhere classified: Secondary | ICD-10-CM | POA: Diagnosis not present

## 2023-06-22 DIAGNOSIS — M25612 Stiffness of left shoulder, not elsewhere classified: Secondary | ICD-10-CM | POA: Diagnosis not present

## 2023-06-22 DIAGNOSIS — M6281 Muscle weakness (generalized): Secondary | ICD-10-CM | POA: Diagnosis not present

## 2023-06-22 DIAGNOSIS — M25512 Pain in left shoulder: Secondary | ICD-10-CM | POA: Diagnosis not present

## 2023-06-22 DIAGNOSIS — S46002A Unspecified injury of muscle(s) and tendon(s) of the rotator cuff of left shoulder, initial encounter: Secondary | ICD-10-CM | POA: Diagnosis not present

## 2023-06-27 DIAGNOSIS — S46002A Unspecified injury of muscle(s) and tendon(s) of the rotator cuff of left shoulder, initial encounter: Secondary | ICD-10-CM | POA: Diagnosis not present

## 2023-06-27 DIAGNOSIS — M6281 Muscle weakness (generalized): Secondary | ICD-10-CM | POA: Diagnosis not present

## 2023-06-27 DIAGNOSIS — M25512 Pain in left shoulder: Secondary | ICD-10-CM | POA: Diagnosis not present

## 2023-06-27 DIAGNOSIS — M25612 Stiffness of left shoulder, not elsewhere classified: Secondary | ICD-10-CM | POA: Diagnosis not present

## 2023-06-29 DIAGNOSIS — M25512 Pain in left shoulder: Secondary | ICD-10-CM | POA: Diagnosis not present

## 2023-06-29 DIAGNOSIS — M25612 Stiffness of left shoulder, not elsewhere classified: Secondary | ICD-10-CM | POA: Diagnosis not present

## 2023-06-29 DIAGNOSIS — S46002A Unspecified injury of muscle(s) and tendon(s) of the rotator cuff of left shoulder, initial encounter: Secondary | ICD-10-CM | POA: Diagnosis not present

## 2023-06-29 DIAGNOSIS — M6281 Muscle weakness (generalized): Secondary | ICD-10-CM | POA: Diagnosis not present

## 2023-07-03 DIAGNOSIS — H35379 Puckering of macula, unspecified eye: Secondary | ICD-10-CM | POA: Diagnosis not present

## 2023-07-03 DIAGNOSIS — M3501 Sicca syndrome with keratoconjunctivitis: Secondary | ICD-10-CM | POA: Diagnosis not present

## 2023-07-03 DIAGNOSIS — H40003 Preglaucoma, unspecified, bilateral: Secondary | ICD-10-CM | POA: Diagnosis not present

## 2023-07-04 DIAGNOSIS — S46002A Unspecified injury of muscle(s) and tendon(s) of the rotator cuff of left shoulder, initial encounter: Secondary | ICD-10-CM | POA: Diagnosis not present

## 2023-07-04 DIAGNOSIS — M25612 Stiffness of left shoulder, not elsewhere classified: Secondary | ICD-10-CM | POA: Diagnosis not present

## 2023-07-04 DIAGNOSIS — M25512 Pain in left shoulder: Secondary | ICD-10-CM | POA: Diagnosis not present

## 2023-07-04 DIAGNOSIS — M6281 Muscle weakness (generalized): Secondary | ICD-10-CM | POA: Diagnosis not present

## 2023-07-06 DIAGNOSIS — M25612 Stiffness of left shoulder, not elsewhere classified: Secondary | ICD-10-CM | POA: Diagnosis not present

## 2023-07-06 DIAGNOSIS — M25512 Pain in left shoulder: Secondary | ICD-10-CM | POA: Diagnosis not present

## 2023-07-06 DIAGNOSIS — M6281 Muscle weakness (generalized): Secondary | ICD-10-CM | POA: Diagnosis not present

## 2023-07-06 DIAGNOSIS — S46002A Unspecified injury of muscle(s) and tendon(s) of the rotator cuff of left shoulder, initial encounter: Secondary | ICD-10-CM | POA: Diagnosis not present

## 2023-07-11 DIAGNOSIS — S46002A Unspecified injury of muscle(s) and tendon(s) of the rotator cuff of left shoulder, initial encounter: Secondary | ICD-10-CM | POA: Diagnosis not present

## 2023-07-14 DIAGNOSIS — M25512 Pain in left shoulder: Secondary | ICD-10-CM | POA: Diagnosis not present

## 2023-07-18 DIAGNOSIS — M6281 Muscle weakness (generalized): Secondary | ICD-10-CM | POA: Diagnosis not present

## 2023-07-18 DIAGNOSIS — M25512 Pain in left shoulder: Secondary | ICD-10-CM | POA: Diagnosis not present

## 2023-07-18 DIAGNOSIS — S46002A Unspecified injury of muscle(s) and tendon(s) of the rotator cuff of left shoulder, initial encounter: Secondary | ICD-10-CM | POA: Diagnosis not present

## 2023-07-18 DIAGNOSIS — M25612 Stiffness of left shoulder, not elsewhere classified: Secondary | ICD-10-CM | POA: Diagnosis not present

## 2023-07-20 DIAGNOSIS — S46002A Unspecified injury of muscle(s) and tendon(s) of the rotator cuff of left shoulder, initial encounter: Secondary | ICD-10-CM | POA: Diagnosis not present

## 2023-07-25 DIAGNOSIS — S46002A Unspecified injury of muscle(s) and tendon(s) of the rotator cuff of left shoulder, initial encounter: Secondary | ICD-10-CM | POA: Diagnosis not present

## 2023-07-25 DIAGNOSIS — M6281 Muscle weakness (generalized): Secondary | ICD-10-CM | POA: Diagnosis not present

## 2023-07-25 DIAGNOSIS — M25512 Pain in left shoulder: Secondary | ICD-10-CM | POA: Diagnosis not present

## 2023-07-25 DIAGNOSIS — M25612 Stiffness of left shoulder, not elsewhere classified: Secondary | ICD-10-CM | POA: Diagnosis not present

## 2023-07-27 DIAGNOSIS — M6281 Muscle weakness (generalized): Secondary | ICD-10-CM | POA: Diagnosis not present

## 2023-07-27 DIAGNOSIS — M25612 Stiffness of left shoulder, not elsewhere classified: Secondary | ICD-10-CM | POA: Diagnosis not present

## 2023-07-27 DIAGNOSIS — S46002A Unspecified injury of muscle(s) and tendon(s) of the rotator cuff of left shoulder, initial encounter: Secondary | ICD-10-CM | POA: Diagnosis not present

## 2023-07-27 DIAGNOSIS — M25512 Pain in left shoulder: Secondary | ICD-10-CM | POA: Diagnosis not present

## 2023-08-01 DIAGNOSIS — S46002A Unspecified injury of muscle(s) and tendon(s) of the rotator cuff of left shoulder, initial encounter: Secondary | ICD-10-CM | POA: Diagnosis not present

## 2023-08-01 DIAGNOSIS — M25612 Stiffness of left shoulder, not elsewhere classified: Secondary | ICD-10-CM | POA: Diagnosis not present

## 2023-08-01 DIAGNOSIS — M6281 Muscle weakness (generalized): Secondary | ICD-10-CM | POA: Diagnosis not present

## 2023-08-01 DIAGNOSIS — M25512 Pain in left shoulder: Secondary | ICD-10-CM | POA: Diagnosis not present

## 2023-08-03 DIAGNOSIS — M25612 Stiffness of left shoulder, not elsewhere classified: Secondary | ICD-10-CM | POA: Diagnosis not present

## 2023-08-03 DIAGNOSIS — M6281 Muscle weakness (generalized): Secondary | ICD-10-CM | POA: Diagnosis not present

## 2023-08-03 DIAGNOSIS — M25512 Pain in left shoulder: Secondary | ICD-10-CM | POA: Diagnosis not present

## 2023-08-03 DIAGNOSIS — S46002A Unspecified injury of muscle(s) and tendon(s) of the rotator cuff of left shoulder, initial encounter: Secondary | ICD-10-CM | POA: Diagnosis not present

## 2023-08-08 DIAGNOSIS — S46002A Unspecified injury of muscle(s) and tendon(s) of the rotator cuff of left shoulder, initial encounter: Secondary | ICD-10-CM | POA: Diagnosis not present

## 2023-08-08 DIAGNOSIS — M6281 Muscle weakness (generalized): Secondary | ICD-10-CM | POA: Diagnosis not present

## 2023-08-08 DIAGNOSIS — M25612 Stiffness of left shoulder, not elsewhere classified: Secondary | ICD-10-CM | POA: Diagnosis not present

## 2023-08-08 DIAGNOSIS — M25512 Pain in left shoulder: Secondary | ICD-10-CM | POA: Diagnosis not present

## 2023-08-11 DIAGNOSIS — M25512 Pain in left shoulder: Secondary | ICD-10-CM | POA: Diagnosis not present

## 2023-08-11 DIAGNOSIS — M25612 Stiffness of left shoulder, not elsewhere classified: Secondary | ICD-10-CM | POA: Diagnosis not present

## 2023-08-11 DIAGNOSIS — S46002A Unspecified injury of muscle(s) and tendon(s) of the rotator cuff of left shoulder, initial encounter: Secondary | ICD-10-CM | POA: Diagnosis not present

## 2023-08-11 DIAGNOSIS — M6281 Muscle weakness (generalized): Secondary | ICD-10-CM | POA: Diagnosis not present

## 2023-08-14 DIAGNOSIS — M25612 Stiffness of left shoulder, not elsewhere classified: Secondary | ICD-10-CM | POA: Diagnosis not present

## 2023-08-14 DIAGNOSIS — M25512 Pain in left shoulder: Secondary | ICD-10-CM | POA: Diagnosis not present

## 2023-08-14 DIAGNOSIS — S46002A Unspecified injury of muscle(s) and tendon(s) of the rotator cuff of left shoulder, initial encounter: Secondary | ICD-10-CM | POA: Diagnosis not present

## 2023-08-14 DIAGNOSIS — M6281 Muscle weakness (generalized): Secondary | ICD-10-CM | POA: Diagnosis not present

## 2023-09-08 DIAGNOSIS — S46002A Unspecified injury of muscle(s) and tendon(s) of the rotator cuff of left shoulder, initial encounter: Secondary | ICD-10-CM | POA: Diagnosis not present

## 2023-09-08 DIAGNOSIS — M25612 Stiffness of left shoulder, not elsewhere classified: Secondary | ICD-10-CM | POA: Diagnosis not present

## 2023-09-08 DIAGNOSIS — M25512 Pain in left shoulder: Secondary | ICD-10-CM | POA: Diagnosis not present

## 2023-09-08 DIAGNOSIS — M6281 Muscle weakness (generalized): Secondary | ICD-10-CM | POA: Diagnosis not present

## 2023-09-11 DIAGNOSIS — M75122 Complete rotator cuff tear or rupture of left shoulder, not specified as traumatic: Secondary | ICD-10-CM | POA: Diagnosis not present

## 2024-01-09 ENCOUNTER — Other Ambulatory Visit: Payer: Self-pay | Admitting: Orthopedic Surgery

## 2024-01-09 DIAGNOSIS — M25511 Pain in right shoulder: Secondary | ICD-10-CM

## 2024-01-09 DIAGNOSIS — S46001A Unspecified injury of muscle(s) and tendon(s) of the rotator cuff of right shoulder, initial encounter: Secondary | ICD-10-CM

## 2024-01-20 ENCOUNTER — Ambulatory Visit
Admission: EM | Admit: 2024-01-20 | Discharge: 2024-01-20 | Disposition: A | Discharge status: Home / Self Care | Attending: Family Medicine | Admitting: Family Medicine

## 2024-01-20 ENCOUNTER — Encounter: Payer: Self-pay | Admitting: Emergency Medicine

## 2024-01-20 ENCOUNTER — Ambulatory Visit

## 2024-01-20 DIAGNOSIS — R051 Acute cough: Secondary | ICD-10-CM | POA: Diagnosis not present

## 2024-01-20 MED ORDER — AMOXICILLIN-POT CLAVULANATE 875-125 MG PO TABS: 1.0000 | ORAL_TABLET | Freq: Two times a day (BID) | ORAL | 0 refills | Status: DC

## 2024-01-20 MED ORDER — PROMETHAZINE-DM 6.25-15 MG/5ML PO SYRP: 5.0000 mL | ORAL_SOLUTION | Freq: Four times a day (QID) | ORAL | 0 refills | Status: DC | PRN

## 2024-01-20 MED ORDER — AZITHROMYCIN 250 MG PO TABS: 250.0000 mg | ORAL_TABLET | Freq: Every day | ORAL | 0 refills | Status: DC

## 2024-01-20 NOTE — ED Provider Notes (Signed)
 " [facility]    CSN: [id number] Arrival date & time: 01/20/24  1005      History   Chief Complaint Chief Complaint  Patient presents with   Cough    HPI 79 year old male presents with persistent cough.  Cough and congestion for the past week.  Subjective fever.  No relieving factors.  Has a history of pneumonia.  Has had no relief with over-the-counter treatment.  Symptoms are worsening.  Past Medical History:  Diagnosis Date   Arthritis    fingers   BPH (benign prostatic hyperplasia)    Diverticulosis    Environmental and seasonal allergies    Hyperlipidemia    Pneumonia    HISTORY    Patient Active Problem List   Diagnosis Date Noted   Cough, persistent 06/08/2021   Allergic sinusitis 04/19/2017   Family history of prostate cancer 04/19/2017   H/O chronic sinusitis 04/19/2017   Abnormal electrocardiogram 03/13/2017   Primary osteoarthritis of first carpometacarpal joint of left hand 02/28/2017   Carpal tunnel syndrome of left wrist 02/14/2017   Chronic pain of left wrist 01/30/2017   Benign prostatic hyperplasia (BPH) with straining on urination 02/23/2016    Past Surgical History:  Procedure Laterality Date   BACK SURGERY     CARPAL TUNNEL RELEASE Left 03/08/2017   Procedure: CARPAL TUNNEL RELEASE;  Surgeon: Maryl Barters, MD;  Location: [facility];  Service: Orthopedics;  Laterality: Left;   CATARACT EXTRACTION W/PHACO Left 07/07/2015   Procedure: CATARACT EXTRACTION PHACO AND INTRAOCULAR LENS PLACEMENT (IOC);  Surgeon: Elsie Carmine, MD;  Location: [facility];  Service: Ophthalmology;  Laterality: Left;  US  00:33.8 AP% 20.7 CDE 7.01 fluid pack lot #[id number] H   CATARACT EXTRACTION W/PHACO Right 08/11/2015   Procedure: CATARACT EXTRACTION PHACO AND INTRAOCULAR LENS PLACEMENT (IOC);  Surgeon: Elsie Carmine, MD;  Location: [facility];  Service: Ophthalmology;  Laterality: Right;  US  49.0 AP% 17.8 CDE 8.73 Fluid Pack lot # [id number]    COLONOSCOPY W/ POLYPECTOMY  01/03/2003   12/04/2008, 01/01/2014   COLONOSCOPY WITH PROPOFOL  N/A 09/01/2017   Procedure: COLONOSCOPY WITH PROPOFOL ;  Surgeon: Viktoria Lamar DASEN, MD;  Location: [facility];  Service: Endoscopy;  Laterality: N/A;   COLONOSCOPY WITH PROPOFOL  N/A 11/29/2022   Procedure: COLONOSCOPY WITH PROPOFOL ;  Surgeon: Toledo, Ladell POUR, MD;  Location: [facility];  Service: Gastroenterology;  Laterality: N/A;   EYE SURGERY     HERNIA REPAIR Right 1975   inguinal, left in 1980   LAMINOTOMY / EXCISION DISK POSTERIOR CERVICAL SPINE  2011   metal in neck   POLYPECTOMY  11/29/2022   Procedure: POLYPECTOMY;  Surgeon: Toledo, Ladell POUR, MD;  Location: [facility];  Service: Gastroenterology;;   RCR     SHOULDER ARTHROSCOPY WITH OPEN ROTATOR CUFF REPAIR Left 04/04/2023   Procedure: ARTHROSCOPY, SHOULDER WITH REPAIR, ROTATOR CUFF, OPEN;  Surgeon: Tobie Priest, MD;  Location: [facility];  Service: Orthopedics;  Laterality: Left;  Left shoulder arthroscopy, mini open rotator cuff repair (supraspinatus)   SHOULDER SURGERY Right 1990   left done 1985   SIGMOIDOSCOPY     TONSILLECTOMY         Home Medications    Prior to Admission medications  Medication Sig Start Date End Date Taking? Authorizing Provider  amoxicillin -clavulanate (AUGMENTIN ) 875-125 MG tablet Take 1 tablet by mouth every 12 (twelve) hours. 01/20/24  Yes Jayleena Stille G, DO  azithromycin  (ZITHROMAX ) 250 MG tablet Take 1 tablet (250 mg total) by mouth daily. Take first 2 tablets together,  then 1 every day until finished. 01/20/24  Yes Timi Reeser G, DO  promethazine -dextromethorphan (PROMETHAZINE -DM) 6.25-15 MG/5ML syrup Take 5 mLs by mouth 4 (four) times daily as needed for cough. 01/20/24  Yes Calaya Gildner G, DO  acetaminophen  (TYLENOL ) 500 MG tablet Take 2 tablets (1,000 mg total) by mouth every 8 (eight) hours. 04/04/23 04/03/24  Tobie Priest, MD  albuterol  (VENTOLIN  HFA) 108 [phone] Base) MCG/ACT inhaler Inhale 2 puffs  into the lungs every 6 (six) hours as needed for wheezing or shortness of breath. 05/06/21   Sood, Vineet, MD  cetirizine  (ZYRTEC ) 10 MG tablet Take 1 tablet (10 mg total) by mouth daily. Patient not taking: Reported on 03/28/2023 10/11/21   Gareth Mliss FALCON, FNP  cyanocobalamin  (VITAMIN B12) 1000 MCG tablet Take 1,000 mcg by mouth daily.    [provider]  fluticasone  (FLONASE ) 50 MCG/ACT nasal spray Place 2 sprays into both nostrils daily. Patient not taking: Reported on 03/28/2023 10/11/21   Gareth Mliss FALCON, FNP  hydrOXYzine  (ATARAX ) 25 MG tablet Take half a tablet to 1 tablet every 4 to 6 hours as needed allergic reaction Patient not taking: Reported on 03/28/2023 09/23/21   Arvis Huxley B, PA-C  ipratropium (ATROVENT ) 0.06 % nasal spray Place 2 sprays into both nostrils 4 (four) times daily. Patient not taking: Reported on 03/28/2023 12/30/22   Brimage, Vondra, DO  ondansetron  (ZOFRAN -ODT) 4 MG disintegrating tablet Take 1 tablet (4 mg total) by mouth every 8 (eight) hours as needed for nausea or vomiting. 04/04/23   Tobie Priest, MD  oxyCODONE  (ROXICODONE ) 5 MG immediate release tablet Take 1-2 tablets (5-10 mg total) by mouth every 4 (four) hours as needed (pain). 04/04/23 04/03/24  Tobie Priest, MD  tamsulosin  (FLOMAX ) 0.4 MG CAPS capsule Take 1 capsule (0.4 mg total) by mouth 30 minutes after the same meal daily 11/04/20     tiotropium (SPIRIVA) 18 MCG inhalation capsule Place 18 mcg into inhaler and inhale daily as needed (shortness of breath). 11/29/21   [provider]    Family History Family History  Problem Relation Age of Onset   Stomach cancer Mother    Prostate cancer Father    Diabetes Father    Bladder Cancer Neg Hx    Kidney cancer Neg Hx     Social History Social History[1]   Allergies   Levonorgestrel-ethinyl estrad   Review of Systems Review of Systems  Respiratory:  Positive for cough.      Physical Exam Triage Vital Signs ED Triage Vitals   Encounter Vitals Group     BP 01/20/24 1048 (!) 162/78     Girls Systolic BP Percentile --      Girls Diastolic BP Percentile --      Boys Systolic BP Percentile --      Boys Diastolic BP Percentile --      Pulse Rate 01/20/24 1048 84     Resp 01/20/24 1048 16     Temp 01/20/24 1048 98 F (36.7 C)     Temp Source 01/20/24 1048 Oral     SpO2 01/20/24 1048 93 %     Weight 01/20/24 1048 206 lb (93.4 kg)     Height 01/20/24 1048 5' 8 (1.727 m)     Head Circumference --      Peak Flow --      Pain Score 01/20/24 1047 0     Pain Loc --      Pain Education --      Exclude  from Growth Chart --    No data found.  Updated Vital Signs BP (!) 162/78 (BP Location: Left Arm)   Pulse 84   Temp 98 F (36.7 C) (Oral)   Resp 16   Ht 5' 8 (1.727 m)   Wt 93.4 kg   SpO2 93%   BMI 31.32 kg/m   Visual Acuity Right Eye Distance:   Left Eye Distance:   Bilateral Distance:    Right Eye Near:   Left Eye Near:    Bilateral Near:     Physical Exam Constitutional:      General: He is not in acute distress.    Appearance: Normal appearance.  HENT:     Head: Normocephalic and atraumatic.  Cardiovascular:     Rate and Rhythm: Normal rate and regular rhythm.  Pulmonary:     Effort: Pulmonary effort is normal.     Comments: Right basilar crackles. Neurological:     Mental Status: He is alert.      UC Treatments / Results  Labs (all labs ordered are listed, but only abnormal results are displayed) Labs Reviewed - No data to display  EKG   Radiology DG Chest 2 View Result Date: 01/20/2024 CLINICAL DATA:  Cough. EXAM: CHEST - 2 VIEW COMPARISON:  03/26/2021 FINDINGS: The heart size and mediastinal contours are within normal limits. There is no evidence of pulmonary edema, consolidation, pneumothorax, nodule or pleural fluid. The visualized skeletal structures are unremarkable. IMPRESSION: No active cardiopulmonary disease. Electronically Signed   By: Marcey Moan M.D.   On:  01/20/2024 12:06    Procedures Procedures (including critical care time)  Medications Ordered in UC Medications - No data to display  Initial Impression / Assessment and Plan / UC Course  I have reviewed the triage vital signs and the nursing notes.  Pertinent labs & imaging results that were available during my care of the patient were reviewed by me and considered in my medical decision making (see chart for details).    79 year old male presents with persistent and productive cough.  Chest x-ray was read as negative by radiology.  I am concerned for community-acquired pneumonia.  Treating with Augmentin  and azithromycin .  Promethazine  DM for cough.  Advised follow-up with PCP.  Final Clinical Impressions(s) / UC Diagnoses   Final diagnoses:  Acute cough     Discharge Instructions      Antibiotics as prescribed.  Follow up with PCP.  Take care  Dr. Bluford   ED Prescriptions     Medication Sig Dispense Auth. Provider   amoxicillin -clavulanate (AUGMENTIN ) 875-125 MG tablet Take 1 tablet by mouth every 12 (twelve) hours. 14 tablet Avana Kreiser G, DO   azithromycin  (ZITHROMAX ) 250 MG tablet Take 1 tablet (250 mg total) by mouth daily. Take first 2 tablets together, then 1 every day until finished. 6 tablet Lonetta Blassingame G, DO   promethazine -dextromethorphan (PROMETHAZINE -DM) 6.25-15 MG/5ML syrup Take 5 mLs by mouth 4 (four) times daily as needed for cough. 118 mL Seana Underwood G, DO      PDMP not reviewed this encounter.    [1]  Social History Tobacco Use   Smoking status: Former    Current packs/day: 0.00    Average packs/day: 1 pack/day for 28.0 years (28.0 ttl pk-yrs)    Types: Cigarettes    Start date: 09/18/1966    Quit date: 09/18/1994    Years since quitting: 29.3   Smokeless tobacco: Never  Vaping Use   Vaping status: Never Used  Substance Use Topics   Alcohol use: Yes    Alcohol/week: 2.0 standard drinks of alcohol    Types: 2 Glasses of wine per week     Comment: occasionally   Drug use: Never     Bayli Quesinberry G, DO 01/20/24 1234  "

## 2024-01-20 NOTE — Discharge Instructions (Signed)
 Antibiotics as prescribed.  Follow up with PCP.  Take care  Dr. Bluford

## 2024-01-20 NOTE — ED Triage Notes (Signed)
Patient c/o cough and chest congestion for a week.  Patient unsure of fevers.

## 2024-01-25 DIAGNOSIS — Z125 Encounter for screening for malignant neoplasm of prostate: Secondary | ICD-10-CM | POA: Insufficient documentation

## 2024-01-25 NOTE — Progress Notes (Unsigned)
 "  01/25/2024 7:55 AM   George Davis 11-16-1945 [id number]   HPI: 79 y.o. male here for initial evaluation of elevated PSA   PSA 4.26 (01/02/24)   - from 3.3 in 2022    PMH: Past Medical History:  Diagnosis Date   Arthritis    fingers   BPH (benign prostatic hyperplasia)    Diverticulosis    Environmental and seasonal allergies    Hyperlipidemia    Pneumonia    HISTORY    Surgical History: Past Surgical History:  Procedure Laterality Date   BACK SURGERY     CARPAL TUNNEL RELEASE Left 03/08/2017   Procedure: CARPAL TUNNEL RELEASE;  Surgeon: Maryl Barters, MD;  Location: [facility];  Service: Orthopedics;  Laterality: Left;   CATARACT EXTRACTION W/PHACO Left 07/07/2015   Procedure: CATARACT EXTRACTION PHACO AND INTRAOCULAR LENS PLACEMENT (IOC);  Surgeon: Elsie Carmine, MD;  Location: [facility];  Service: Ophthalmology;  Laterality: Left;  US  00:33.8 AP% 20.7 CDE 7.01 fluid pack lot #[id number] H   CATARACT EXTRACTION W/PHACO Right 08/11/2015   Procedure: CATARACT EXTRACTION PHACO AND INTRAOCULAR LENS PLACEMENT (IOC);  Surgeon: Elsie Carmine, MD;  Location: [facility];  Service: Ophthalmology;  Laterality: Right;  US  49.0 AP% 17.8 CDE 8.73 Fluid Pack lot # [id number] H   COLONOSCOPY W/ POLYPECTOMY  01/03/2003   12/04/2008, 01/01/2014   COLONOSCOPY WITH PROPOFOL  N/A 09/01/2017   Procedure: COLONOSCOPY WITH PROPOFOL ;  Surgeon: Viktoria Lamar DASEN, MD;  Location: [facility];  Service: Endoscopy;  Laterality: N/A;   COLONOSCOPY WITH PROPOFOL  N/A 11/29/2022   Procedure: COLONOSCOPY WITH PROPOFOL ;  Surgeon: Toledo, Ladell POUR, MD;  Location: [facility];  Service: Gastroenterology;  Laterality: N/A;   EYE SURGERY     HERNIA REPAIR Right 1975   inguinal, left in 1980   LAMINOTOMY / EXCISION DISK POSTERIOR CERVICAL SPINE  2011   metal in neck   POLYPECTOMY  11/29/2022   Procedure: POLYPECTOMY;  Surgeon: Toledo, Ladell POUR, MD;  Location: [facility];  Service:  Gastroenterology;;   RCR     SHOULDER ARTHROSCOPY WITH OPEN ROTATOR CUFF REPAIR Left 04/04/2023   Procedure: ARTHROSCOPY, SHOULDER WITH REPAIR, ROTATOR CUFF, OPEN;  Surgeon: Tobie Priest, MD;  Location: [facility];  Service: Orthopedics;  Laterality: Left;  Left shoulder arthroscopy, mini open rotator cuff repair (supraspinatus)   SHOULDER SURGERY Right 1990   left done 1985   SIGMOIDOSCOPY     TONSILLECTOMY      Family History: Family History  Problem Relation Age of Onset   Stomach cancer Mother    Prostate cancer Father    Diabetes Father    Bladder Cancer Neg Hx    Kidney cancer Neg Hx     Social History:  reports that he quit smoking about 29 years ago. His smoking use included cigarettes. He started smoking about 57 years ago. He has a 28 pack-year smoking history. He has never used smokeless tobacco. He reports current alcohol use of about 2.0 standard drinks of alcohol per week. He reports that he does not use drugs.      Physical Exam: There were no vitals taken for this visit.   Constitutional:  Alert and oriented, No acute distress. Cardiovascular: No clubbing, cyanosis, or edema. Respiratory: Normal respiratory effort, no increased work of breathing. GI: Nondistended GU: *** Skin: No rashes, bruises or suspicious lesions. Neurologic: Grossly intact, no focal deficits, moving all 4 extremities. Psychiatric: Normal mood and affect.  Laboratory Data: Component Ref Range & Units 3 wk ago  PSA (Prostate Specific Antigen), Total 0.10 - 4.00 ng/mL 4.26 High    Component Ref Range & Units 3 yr ago  PSA (Prostate Specific Antigen), Total 0.10 - 4.00 ng/mL 3.30   Component Ref Range & Units 4 yr ago  PSA  (Prostate Specific Antigen), Total 0.10 - 4.00 ng/mL 2.56     Component Ref Range & Units (hover) 5 yr ago 6 yr ago  Prostate Specific Ag, Serum 2.8 2.4 CM   Pertinent Imaging: N/A    Assessment & Plan:    Encounter for screening prostate specific  antigen (PSA) measurement Assessment & Plan: PSA 4.26 (01/02/24)   - from 2-3.3 baseline since 2020  Reviewed his clinical history and historic PSA data.  His most recent PSA of 4.26 as well within a normal age-adjusted threshold, as well as reasonably close to his historic baseline. While this does not exclude a low-grade prostate cancer, which is increasingly prevalent in this age group, it does reassure against the possibility of underlying significant or aggressive disease. Explained the pros/cons of screening, aggressive workup, procedure burden +/- possible complications.        Penne Skye, MD 01/25/2024  [facility] [address], [address] [place], [place] [address] [phone] "

## 2024-01-25 NOTE — Assessment & Plan Note (Addendum)
 PSA 4.26 (01/02/24)   - from 2-3.3 baseline since 2020  Reviewed his clinical history and historic PSA data.  His most recent PSA of 4.26 as well within a normal age-adjusted threshold, as well as reasonably close to his historic baseline. While this does not exclude a low-grade prostate cancer, which is increasingly prevalent in this age group, it does reassure against the possibility of underlying significant or aggressive disease. Explained the pros/cons of screening, aggressive workup, procedure burden +/- possible complications.

## 2024-01-30 ENCOUNTER — Encounter: Payer: Self-pay | Admitting: Orthopedic Surgery

## 2024-01-31 ENCOUNTER — Ambulatory Visit: Admitting: Urology

## 2024-01-31 ENCOUNTER — Encounter: Payer: Self-pay | Admitting: Urology

## 2024-01-31 VITALS — BP 137/79 | HR 93 | Ht 68.0 in | Wt 210.8 lb

## 2024-01-31 DIAGNOSIS — Z125 Encounter for screening for malignant neoplasm of prostate: Secondary | ICD-10-CM | POA: Diagnosis not present

## 2024-01-31 DIAGNOSIS — N401 Enlarged prostate with lower urinary tract symptoms: Secondary | ICD-10-CM | POA: Diagnosis not present

## 2024-01-31 DIAGNOSIS — R3916 Straining to void: Secondary | ICD-10-CM | POA: Diagnosis not present

## 2024-01-31 MED ORDER — FINASTERIDE 5 MG PO TABS: 5.0000 mg | ORAL_TABLET | Freq: Every day | ORAL | 2 refills | Status: AC

## 2024-01-31 MED ORDER — TAMSULOSIN HCL 0.4 MG PO CAPS: 0.4000 mg | ORAL_CAPSULE | Freq: Every day | ORAL | 3 refills | Status: AC

## 2024-01-31 NOTE — Assessment & Plan Note (Signed)
 Chronic BPH, weak stream, hesitanc  -~50g DRE  - on Flomax   Today we reviewed the physiology and common causes of male lower urinary tract symptoms (LUTS). Discussed potential etiologies including infectious, inflammatory, bladder-related, benign prostatic hyperplasia (BPH), and musculoskeletal/pelvic floor contributions. Reviewed the standard diagnostic workup (urinalysis, PVR, uroflow, prostate assessment, possible cystoscopy or imaging) and the spectrum of initial management strategies ranging from behavioral and lifestyle measures to pharmacologic therapy, with procedural options if indicated. All questions were addressed and the patient expressed understanding of the evaluation and treatment pathway.  - Continue Flomax  - will add Finasteride  for combo therapy - RTC in ~5-6 mo for IPSS, PVR, symptom check

## 2024-01-31 NOTE — Addendum Note (Signed)
 Addended by: Zackery Brine E on: 01/31/2024 09:01 AM   Modules accepted: Orders

## 2024-02-01 ENCOUNTER — Other Ambulatory Visit

## 2024-02-02 ENCOUNTER — Other Ambulatory Visit

## 2024-02-02 DIAGNOSIS — M25511 Pain in right shoulder: Secondary | ICD-10-CM

## 2024-02-02 DIAGNOSIS — S46001A Unspecified injury of muscle(s) and tendon(s) of the rotator cuff of right shoulder, initial encounter: Secondary | ICD-10-CM

## 2024-07-31 ENCOUNTER — Ambulatory Visit: Admitting: Urology
